# Patient Record
Sex: Male | Born: 1935 | Race: White | Hispanic: No | Marital: Married | Smoking: Never smoker
Health system: Southern US, Community
[De-identification: ages and names within clinical notes are randomized; demographics above are authoritative.]

## PROBLEM LIST (undated history)

## (undated) DIAGNOSIS — I10 Essential (primary) hypertension: Secondary | ICD-10-CM

## (undated) DIAGNOSIS — G9340 Encephalopathy, unspecified: Secondary | ICD-10-CM

## (undated) DIAGNOSIS — I2510 Atherosclerotic heart disease of native coronary artery without angina pectoris: Secondary | ICD-10-CM

## (undated) DIAGNOSIS — J13 Pneumonia due to Streptococcus pneumoniae: Secondary | ICD-10-CM

## (undated) DIAGNOSIS — F0390 Unspecified dementia without behavioral disturbance: Secondary | ICD-10-CM

## (undated) DIAGNOSIS — N289 Disorder of kidney and ureter, unspecified: Secondary | ICD-10-CM

## (undated) DIAGNOSIS — C61 Malignant neoplasm of prostate: Secondary | ICD-10-CM

## (undated) HISTORY — PX: CARDIAC SURGERY: SHX584

## (undated) HISTORY — PX: INSERTION PROSTATE RADIATION SEED: SUR718

## (undated) HISTORY — PX: EYE SURGERY: SHX253

## (undated) HISTORY — PX: INGUINAL HERNIA REPAIR: SUR1180

---

## 1994-03-04 HISTORY — PX: CORONARY ARTERY BYPASS GRAFT: SHX141

## 2012-02-20 ENCOUNTER — Emergency Department (HOSPITAL_COMMUNITY)
Admission: EM | Admit: 2012-02-20 | Discharge: 2012-02-20 | Disposition: A | Discharge status: Home / Self Care | Payer: Medicare Other | Attending: Emergency Medicine | Admitting: Emergency Medicine

## 2012-02-20 ENCOUNTER — Encounter (HOSPITAL_COMMUNITY): Payer: Self-pay | Admitting: Emergency Medicine

## 2012-02-20 ENCOUNTER — Emergency Department (HOSPITAL_COMMUNITY): Payer: Medicare Other

## 2012-02-20 DIAGNOSIS — Z87891 Personal history of nicotine dependence: Secondary | ICD-10-CM | POA: Insufficient documentation

## 2012-02-20 DIAGNOSIS — F0390 Unspecified dementia without behavioral disturbance: Secondary | ICD-10-CM | POA: Insufficient documentation

## 2012-02-20 DIAGNOSIS — Y9210 Unspecified residential institution as the place of occurrence of the external cause: Secondary | ICD-10-CM | POA: Insufficient documentation

## 2012-02-20 DIAGNOSIS — Z87448 Personal history of other diseases of urinary system: Secondary | ICD-10-CM | POA: Insufficient documentation

## 2012-02-20 DIAGNOSIS — Z8701 Personal history of pneumonia (recurrent): Secondary | ICD-10-CM | POA: Insufficient documentation

## 2012-02-20 DIAGNOSIS — Y939 Activity, unspecified: Secondary | ICD-10-CM | POA: Insufficient documentation

## 2012-02-20 DIAGNOSIS — Z79899 Other long term (current) drug therapy: Secondary | ICD-10-CM | POA: Insufficient documentation

## 2012-02-20 DIAGNOSIS — S0181XA Laceration without foreign body of other part of head, initial encounter: Secondary | ICD-10-CM

## 2012-02-20 DIAGNOSIS — S0180XA Unspecified open wound of other part of head, initial encounter: Secondary | ICD-10-CM | POA: Insufficient documentation

## 2012-02-20 DIAGNOSIS — W19XXXA Unspecified fall, initial encounter: Secondary | ICD-10-CM

## 2012-02-20 DIAGNOSIS — W1809XA Striking against other object with subsequent fall, initial encounter: Secondary | ICD-10-CM | POA: Insufficient documentation

## 2012-02-20 DIAGNOSIS — Z7982 Long term (current) use of aspirin: Secondary | ICD-10-CM | POA: Insufficient documentation

## 2012-02-20 HISTORY — DX: Unspecified dementia, unspecified severity, without behavioral disturbance, psychotic disturbance, mood disturbance, and anxiety: F03.90

## 2012-02-20 HISTORY — DX: Pneumonia due to Streptococcus pneumoniae: J13

## 2012-02-20 HISTORY — DX: Disorder of kidney and ureter, unspecified: N28.9

## 2012-02-20 LAB — COMPREHENSIVE METABOLIC PANEL
AST: 30 U/L (ref 0–37)
CO2: 26 mEq/L (ref 19–32)
Calcium: 9.6 mg/dL (ref 8.4–10.5)
Creatinine, Ser: 1.26 mg/dL (ref 0.50–1.35)
GFR calc non Af Amer: 51 mL/min — ABNORMAL LOW (ref >90)

## 2012-02-20 LAB — CBC WITH DIFFERENTIAL/PLATELET
Basophils Absolute: 0 10*3/uL (ref 0.0–0.1)
Basophils Relative: 0 % (ref 0–1)
Eosinophils Absolute: 0.7 10*3/uL (ref 0.0–0.7)
Eosinophils Relative: 7 % — ABNORMAL HIGH (ref 0–5)
HCT: 44.3 % (ref 39.0–52.0)
Lymphocytes Relative: 22 % (ref 12–46)
MCH: 33 pg (ref 26.0–34.0)
MCHC: 33.6 g/dL (ref 30.0–36.0)
MCV: 98 fL (ref 78.0–100.0)
Monocytes Absolute: 0.7 10*3/uL (ref 0.1–1.0)
RDW: 14 % (ref 11.5–15.5)

## 2012-02-20 MED ORDER — LIDOCAINE-EPINEPHRINE-TETRACAINE (LET) SOLUTION
3.0000 mL | Freq: Once | NASAL | Status: AC
  Administered 2012-02-20: 3 mL via TOPICAL
  Filled 2012-02-20: qty 3

## 2012-02-20 NOTE — ED Notes (Signed)
Informed Dr Devoria Albe that LET been applied to area approx 30 mins. Area is white, blanch looking.

## 2012-02-20 NOTE — ED Provider Notes (Signed)
History     CSN: [id number]  Arrival date & time 02/20/12  0806   First MD Initiated Contact with Patient 02/20/12 [phone]      Chief Complaint  Patient presents with  . Fall  . face aceration    Level V caveat for dementia  (Consider location/radiation/quality/duration/timing/severity/associated sxs/prior treatment) HPI Patient presents via EMS with c-collar and KED in place. Evidently sometime this morning he tried to get up out of bed instead of calling for help and fell near his bed. Nursing staff denied loss of consciousness to EMS however his fall was unwitnessed. Patient hit his head and has a laceration near his left lateral eyebrow. Patient's only complaint is that he's getting a headache and he is complaining about the  c-collar.  Daughter is here. She states prior to 3 months ago patient was living at home however he got to the point where he was confused and agitated and could not figure out how to walk. He was in rehabilitation for 3 months and was walking well when he was transferred to a local nursing home about a week ago. She reports he has not gotten physical therapy since he has been there. She also expresses concern that he has a lot of shaking and he needs a straw to drink liquids and this nursing facility has not been doing that. She also is concerned they're not giving has his snacks. She is concerned that he may be dehydrated. She reports otherwise he seems like his usual self.  PCP physicians home visits? Dr. Redmond School  Past Medical History  Diagnosis Date  . Dementia   . Renal disorder   . Pneumococcal pneumonia     Past Surgical History  Procedure Date  . Cardiac surgery     No family history on file.  History  Substance Use Topics  . Smoking status: Quit smoker years ago  . Smokeless tobacco: Not on file  . Alcohol Use: No  Retired alcohol distributer Lives in NH   Review of Systems  All other systems reviewed and are negative.    Allergies    Review of patient's allergies indicates no known allergies.  Home Medications   Current Outpatient Rx  Name  Route  Sig  Dispense  Refill  . ALBUTEROL SULFATE HFA 108 (90 BASE) MCG/ACT IN AERS   Inhalation   Inhale 2 puffs into the lungs every 6 (six) hours as needed. breathing         . ASPIRIN EC 81 MG PO TBEC   Oral   Take 81 mg by mouth daily.         . ATORVASTATIN CALCIUM 20 MG PO TABS   Oral   Take 20 mg by mouth daily.         Marland Kitchen CALCIUM CARBONATE 1250 MG PO CHEW   Oral   Chew 1 tablet by mouth 2 (two) times daily.         Marland Kitchen VITAMIN D-3 1000 UNITS PO CAPS   Oral   Take 1 capsule by mouth daily.         Marland Kitchen CITALOPRAM HYDROBROMIDE 20 MG PO TABS   Oral   Take 20 mg by mouth daily.         . GENTAMICIN SULFATE 0.3 % OP SOLN   Both Eyes   Place 2 drops into both eyes every 4 (four) hours.         Marland Kitchen BABY SHAMPOO EX   Apply externally  Apply 1 application topically daily. Gentle scrub to right eye daily         . LIDOCAINE 5 % EX PTCH   Transdermal   Place 1 patch onto the skin daily. Remove & Discard patch within 12 hours or as directed by MD         . CEROVITE SENIOR PO   Oral   Take 1 capsule by mouth 2 (two) times daily.         Marland Kitchen TAMSULOSIN HCL 0.4 MG PO CAPS   Oral   Take 0.4 mg by mouth daily.           BP 111/93  Pulse 64  Temp 97.3 F (36.3 C) (Oral)  Resp 20  SpO2 99%  Vital signs normal    Physical Exam  Nursing note and vitals reviewed. Constitutional: He appears well-developed and well-nourished.  Non-toxic appearance. He does not appear ill. No distress.       Alert, cooperative  HENT:  Head: Normocephalic.    Right Ear: External ear normal.  Left Ear: External ear normal.  Nose: Nose normal. No mucosal edema or rhinorrhea.  Mouth/Throat: Oropharynx is clear and moist and mucous membranes are normal. No dental abscesses or uvula swelling.       Patient has a 3 cm linear laceration in his lateral left  eyebrow with dried blood around it. The laceration is through the dermis. It is not actively bleeding at this time.  Eyes: Conjunctivae normal and EOM are normal. Pupils are equal, round, and reactive to light.  Neck: Full passive range of motion without pain.       C-collar in place  Cardiovascular: Normal rate, regular rhythm and normal heart sounds.  Exam reveals no gallop and no friction rub.   No murmur heard. Pulmonary/Chest: Effort normal and breath sounds normal. No respiratory distress. He has no wheezes. He has no rhonchi. He has no rales. He exhibits no tenderness and no crepitus.  Abdominal: Soft. Normal appearance and bowel sounds are normal. He exhibits no distension. There is no tenderness. There is no rebound and no guarding.  Musculoskeletal: Normal range of motion. He exhibits no edema and no tenderness.       Moves all extremities well. Denies any pain on range of motion.  Neurological: He is alert. He has normal strength. No cranial nerve deficit.       Follows commands  Skin: Skin is warm, dry and intact. No rash noted. No erythema. No pallor.  Psychiatric: He has a normal mood and affect. His speech is normal and behavior is normal. His mood appears not anxious.    ED Course  Procedures (including critical care time)   10:10 C collar removed, wound still has dried blood around it and LET hasn't been applied yet.   LACERATION REPAIR Performed by: Ward Givens Authorized by: Ward Givens Consent: Verbal consent obtained. Risks and benefits: risks, benefits and alternatives were discussed Consent given by: patient Patient identity confirmed: provided demographic data Prepped and Draped in normal sterile fashion Wound explored  Laceration Location: lateral left eyebrow  Laceration Length: 3 cm  No Foreign Bodies seen or palpated  Topical anesthetic LET  Amount of cleaning: standard  Skin closure: dermabond  Patient tolerance: Patient tolerated the procedure  well with no immediate complications.  Patient ate a sandwich and drink with no difficulty in the ED. His daughter is satisfied that he is able to eat and feels comfortable taking him back to  his nursing home now.  Results for orders placed during the hospital encounter of 02/20/12  CBC WITH DIFFERENTIAL      Component Value Range   WBC 10.0  4.0 - 10.5 K/uL   RBC 4.52  4.22 - 5.81 MIL/uL   Hemoglobin 14.9  13.0 - 17.0 g/dL   HCT 16.1  09.6 - 04.5 %   MCV 98.0  78.0 - 100.0 fL   MCH 33.0  26.0 - 34.0 pg   MCHC 33.6  30.0 - 36.0 g/dL   RDW 40.9  81.1 - 91.4 %   Platelets 193  150 - 400 K/uL   Neutrophils Relative 64  43 - 77 %   Neutro Abs 6.5  1.7 - 7.7 K/uL   Lymphocytes Relative 22  12 - 46 %   Lymphs Abs 2.2  0.7 - 4.0 K/uL   Monocytes Relative 7  3 - 12 %   Monocytes Absolute 0.7  0.1 - 1.0 K/uL   Eosinophils Relative 7 (*) 0 - 5 %   Eosinophils Absolute 0.7  0.0 - 0.7 K/uL   Basophils Relative 0  0 - 1 %   Basophils Absolute 0.0  0.0 - 0.1 K/uL  COMPREHENSIVE METABOLIC PANEL      Component Value Range   Sodium 140  135 - 145 mEq/L   Potassium 3.8  3.5 - 5.1 mEq/L   Chloride 105  96 - 112 mEq/L   CO2 26  19 - 32 mEq/L   Glucose, Bld 89  70 - 99 mg/dL   BUN 20  6 - 23 mg/dL   Creatinine, Ser 7.82  0.50 - 1.35 mg/dL   Calcium 9.6  8.4 - 95.6 mg/dL   Total Protein 6.8  6.0 - 8.3 g/dL   Albumin 3.6  3.5 - 5.2 g/dL   AST 30  0 - 37 U/L   ALT 28  0 - 53 U/L   Alkaline Phosphatase 139 (*) 39 - 117 U/L   Total Bilirubin 0.8  0.3 - 1.2 mg/dL   GFR calc non Af Amer 51 (*) >90 mL/min   GFR calc Af Amer 59 (*) >90 mL/min   Laboratory interpretation all normal    Ct Head Wo Contrast  Ct Cervical Spine Wo Contrast  02/20/2012  *RADIOLOGY REPORT*  Clinical Data:  Fall  CT HEAD WITHOUT CONTRAST CT CERVICAL SPINE WITHOUT CONTRAST  Technique:  Multidetector CT imaging of the head and cervical spine was performed following the standard protocol without intravenous contrast.   Multiplanar CT image reconstructions of the cervical spine were also generated.  Comparison:   None  CT HEAD  Findings: Moderate atrophy.  Chronic microvascular ischemic changes in the white matter.  No intracranial hemorrhage, mass, or acute infarct.  Negative for skull fracture.  Air-fluid level and bubbly secretions in the left maxillary sinus compatible with sinusitis.  IMPRESSION: No acute intracranial abnormality.  Acute sinusitis.  CT CERVICAL SPINE  Findings: Negative for fracture.  Moderate disc degeneration and spondylosis throughout the cervical spine from C3-C7.  There is moderate facet degeneration bilaterally.  There is extensive degenerative changes C1-C2 with thickening and calcification of the transverse ligament of C1. There is foraminal encroachment at multiple levels due to bony overgrowth.  No soft tissue mass.  IMPRESSION: Negative for fracture.   Original Report Authenticated By: Janeece Riggers, M.D.      1. Fall   2. Laceration of face    Plan discharge  Devoria Albe, MD, Armando Gang  MDM          Ward Givens, MD 02/20/12 1147

## 2012-02-20 NOTE — ED Notes (Signed)
ZOX:[id number]<AV> Expected date:02/20/12<BR> Expected time: 7:56 AM<BR> Means of arrival:Ambulance<BR> Comments:<BR> ems/fall

## 2012-02-20 NOTE — ED Notes (Signed)
Pt. Unable to urinate at this time. Notified the nurse.

## 2012-02-20 NOTE — ED Notes (Signed)
Per EMS pt from [facility] [place] where pt was trying to get out of bed to bathroom this am when he fell, per facility staff pt didn't loose consciousness and either hit head on bed rail or on nightstand because pt has laceration above left eye that is currently not bleeding. Pt has PMH dementia. Pt denies pain at this time.

## 2012-03-29 ENCOUNTER — Emergency Department (HOSPITAL_COMMUNITY): Payer: Medicare HMO

## 2012-03-29 ENCOUNTER — Encounter (HOSPITAL_COMMUNITY): Payer: Self-pay | Admitting: Emergency Medicine

## 2012-03-29 ENCOUNTER — Emergency Department (HOSPITAL_COMMUNITY)
Admission: EM | Admit: 2012-03-29 | Discharge: 2012-03-29 | Disposition: A | Discharge status: Other Acute Inpatient Hospital | Payer: Medicare HMO | Attending: Emergency Medicine | Admitting: Emergency Medicine

## 2012-03-29 DIAGNOSIS — Y9210 Unspecified residential institution as the place of occurrence of the external cause: Secondary | ICD-10-CM | POA: Insufficient documentation

## 2012-03-29 DIAGNOSIS — N259 Disorder resulting from impaired renal tubular function, unspecified: Secondary | ICD-10-CM | POA: Insufficient documentation

## 2012-03-29 DIAGNOSIS — R5381 Other malaise: Secondary | ICD-10-CM | POA: Insufficient documentation

## 2012-03-29 DIAGNOSIS — R5383 Other fatigue: Secondary | ICD-10-CM | POA: Insufficient documentation

## 2012-03-29 DIAGNOSIS — I10 Essential (primary) hypertension: Secondary | ICD-10-CM | POA: Insufficient documentation

## 2012-03-29 DIAGNOSIS — W050XXA Fall from non-moving wheelchair, initial encounter: Secondary | ICD-10-CM | POA: Insufficient documentation

## 2012-03-29 DIAGNOSIS — F0390 Unspecified dementia without behavioral disturbance: Secondary | ICD-10-CM | POA: Insufficient documentation

## 2012-03-29 DIAGNOSIS — M542 Cervicalgia: Secondary | ICD-10-CM | POA: Insufficient documentation

## 2012-03-29 DIAGNOSIS — Z7982 Long term (current) use of aspirin: Secondary | ICD-10-CM | POA: Insufficient documentation

## 2012-03-29 DIAGNOSIS — W19XXXA Unspecified fall, initial encounter: Secondary | ICD-10-CM

## 2012-03-29 DIAGNOSIS — Z8701 Personal history of pneumonia (recurrent): Secondary | ICD-10-CM | POA: Insufficient documentation

## 2012-03-29 DIAGNOSIS — Y9389 Activity, other specified: Secondary | ICD-10-CM | POA: Insufficient documentation

## 2012-03-29 DIAGNOSIS — Z79899 Other long term (current) drug therapy: Secondary | ICD-10-CM | POA: Insufficient documentation

## 2012-03-29 HISTORY — DX: Essential (primary) hypertension: I10

## 2012-03-29 LAB — POCT I-STAT TROPONIN I: Troponin i, poc: 0 ng/mL (ref 0.00–0.08)

## 2012-03-29 LAB — CBC
HCT: 44.6 % (ref 39.0–52.0)
Hemoglobin: 15 g/dL (ref 13.0–17.0)
MCH: 33.6 pg (ref 26.0–34.0)
MCHC: 33.6 g/dL (ref 30.0–36.0)
MCV: 100 fL (ref 78.0–100.0)
Platelets: 222 10*3/uL (ref 150–400)
RBC: 4.46 MIL/uL (ref 4.22–5.81)
RDW: 14.2 % (ref 11.5–15.5)
WBC: 9.6 10*3/uL (ref 4.0–10.5)

## 2012-03-29 LAB — BASIC METABOLIC PANEL
BUN: 21 mg/dL (ref 6–23)
CO2: 25 mEq/L (ref 19–32)
Calcium: 8.9 mg/dL (ref 8.4–10.5)
Chloride: 105 mEq/L (ref 96–112)
Creatinine, Ser: 1.41 mg/dL — ABNORMAL HIGH (ref 0.50–1.35)
GFR calc Af Amer: 51 mL/min — ABNORMAL LOW (ref >90)
GFR calc non Af Amer: 44 mL/min — ABNORMAL LOW (ref >90)
Glucose, Bld: 93 mg/dL (ref 70–99)
Potassium: 4.3 mEq/L (ref 3.5–5.1)
Sodium: 141 mEq/L (ref 135–145)

## 2012-03-29 NOTE — ED Notes (Signed)
ZOX:WR60<AV> Expected date:03/29/12<BR> Expected time: 5:30 PM<BR> Means of arrival:Ambulance<BR> Comments:<BR> Fall, LSB

## 2012-03-29 NOTE — ED Provider Notes (Signed)
History     CSN: [id number]  Arrival date & time 03/29/12  1738   First MD Initiated Contact with Patient 03/29/12 1801      Chief Complaint  Patient presents with  . Fall  . Neck Pain    (Consider location/radiation/quality/duration/timing/severity/associated sxs/prior treatment) HPI Patient presents to the ER in a KED after a fall at his nursing facility. The fall was unwitnessed. The patient cannot recall how he got back up. Patient is unable to provide details and keeps mentioning having a psychiatry appointment earlier today. He is frustrated and says that he feels incapable of doing anything and that he's been much more depressed lately. He says he has been feeling increasingly weak all over and complains of dizziness and "unsteadiness". He also reports mild neck pain. Patient is a level 5 caveat due to dementia, and therefore the ROS was unable to be completed.  Past Medical History  Diagnosis Date  . Dementia   . Renal disorder   . Pneumococcal pneumonia   . Hypertension     Past Surgical History  Procedure Date  . Cardiac surgery     No family history on file.  History  Substance Use Topics  . Smoking status: Never Smoker   . Smokeless tobacco: Not on file  . Alcohol Use: No      Review of Systems Level 5 caveat due to dementia   Allergies  Avelox  Home Medications   Current Outpatient Rx  Name  Route  Sig  Dispense  Refill  . ALBUTEROL SULFATE HFA 108 (90 BASE) MCG/ACT IN AERS   Inhalation   Inhale 2 puffs into the lungs 3 (three) times daily. breathing         . ASPIRIN 81 MG PO CHEW   Oral   Chew 81 mg by mouth daily.         . ATORVASTATIN CALCIUM 20 MG PO TABS   Oral   Take 20 mg by mouth at bedtime.          Marland Kitchen CALCIUM CARBONATE ANTACID 500 MG PO CHEW   Oral   Chew 1 tablet by mouth 2 (two) times daily.         Marland Kitchen VITAMIN D 1000 UNITS PO TABS   Oral   Take 1,000 Units by mouth daily.         Marland Kitchen CITALOPRAM HYDROBROMIDE 20  MG PO TABS   Oral   Take 20 mg by mouth daily.         Marland Kitchen HYPROMELLOSE 0.4 % OP SOLN   Both Eyes   Place 2 drops into both eyes 4 (four) times daily.         Marland Kitchen BABY SHAMPOO EX   Apply externally   Apply 1 application topically daily. Gentle scrub both eye lids daily as directed         . MIRTAZAPINE 15 MG PO TABS   Oral   Take 15 mg by mouth at bedtime.         . CEROVITE SENIOR PO   Oral   Take 1 capsule by mouth 2 (two) times daily.         Marland Kitchen POLYETHYLENE GLYCOL 3350 PO PACK   Oral   Take 17 g by mouth daily as needed. Constipation         . TAMSULOSIN HCL 0.4 MG PO CAPS   Oral   Take 0.4 mg by mouth daily.         Marland Kitchen  TIOTROPIUM BROMIDE MONOHYDRATE 18 MCG IN CAPS   Inhalation   Place 18 mcg into inhaler and inhale daily.            BP 103/58  Pulse 72  Temp 98.5 F (36.9 C) (Oral)  Resp 16  SpO2 100%  Physical Exam  Constitutional: He appears well-developed and well-nourished. No distress.  HENT:  Head: Normocephalic and atraumatic.  Mouth/Throat: Oropharynx is clear and moist.  Eyes: Conjunctivae normal and EOM are normal. Pupils are equal, round, and reactive to light. Right conjunctiva is not injected. Right conjunctiva has no hemorrhage. No scleral icterus.  Cardiovascular: Normal rate, regular rhythm and normal heart sounds.  Exam reveals no gallop and no friction rub.   No murmur heard. Pulmonary/Chest: Effort normal and breath sounds normal.  Neurological: He is alert. No cranial nerve deficit.  Skin: Skin is warm and dry. No rash noted. No erythema. No pallor.  Psychiatric: His speech is normal and behavior is normal. He expresses no homicidal and no suicidal ideation.    ED Course  Procedures (including critical care time)  Patient awaking labs and x-rays  MDM          Carlyle Dolly, PA-C 03/29/12 2101

## 2012-03-29 NOTE — ED Provider Notes (Signed)
Medical screening examination/treatment/procedure(s) were conducted as a shared visit with non-physician practitioner(s) and myself.  I personally evaluated the patient during the encounter    Illya Gienger, MD 03/29/12 2306 

## 2012-03-29 NOTE — ED Notes (Signed)
Pt made aware of need for urine specimen. Pt unable to urinate at this time. Will notify staff when sample is provide. Will continue to monitor.

## 2012-03-29 NOTE — ED Notes (Signed)
Pt from [facility] via EMS post fall. Pt c/o L sided neck pain. Pt was transferring from wheelchair to bed and fell. Pt did not hit head, have LOC and has no visible injury. Pt in NAD and A&O

## 2012-03-29 NOTE — ED Notes (Signed)
NT informed me that she was unable to complete the in & out cath d/t resistance met during insertion. Catheter was unable to be advanced to the bladder. However, the pt's depends were saturated with urine. Going to try removing the depends and catching UO into a urinal.

## 2012-03-29 NOTE — ED Notes (Signed)
Report called to Ney NT, Luna Kitchens.

## 2012-03-29 NOTE — ED Notes (Signed)
Bedside report received from previous RN 

## 2012-03-29 NOTE — ED Notes (Signed)
[facility] called for transport.  

## 2012-07-12 ENCOUNTER — Emergency Department (HOSPITAL_COMMUNITY)
Admission: EM | Admit: 2012-07-12 | Discharge: 2012-07-12 | Disposition: A | Discharge status: Home / Self Care | Payer: Medicare HMO | Attending: Emergency Medicine | Admitting: Emergency Medicine

## 2012-07-12 ENCOUNTER — Encounter (HOSPITAL_COMMUNITY): Payer: Self-pay

## 2012-07-12 DIAGNOSIS — Z7982 Long term (current) use of aspirin: Secondary | ICD-10-CM | POA: Insufficient documentation

## 2012-07-12 DIAGNOSIS — Z9889 Other specified postprocedural states: Secondary | ICD-10-CM | POA: Insufficient documentation

## 2012-07-12 DIAGNOSIS — Y9389 Activity, other specified: Secondary | ICD-10-CM | POA: Insufficient documentation

## 2012-07-12 DIAGNOSIS — S51011A Laceration without foreign body of right elbow, initial encounter: Secondary | ICD-10-CM

## 2012-07-12 DIAGNOSIS — Z8701 Personal history of pneumonia (recurrent): Secondary | ICD-10-CM | POA: Insufficient documentation

## 2012-07-12 DIAGNOSIS — Z87448 Personal history of other diseases of urinary system: Secondary | ICD-10-CM | POA: Insufficient documentation

## 2012-07-12 DIAGNOSIS — Z79899 Other long term (current) drug therapy: Secondary | ICD-10-CM | POA: Insufficient documentation

## 2012-07-12 DIAGNOSIS — Y929 Unspecified place or not applicable: Secondary | ICD-10-CM | POA: Insufficient documentation

## 2012-07-12 DIAGNOSIS — W19XXXA Unspecified fall, initial encounter: Secondary | ICD-10-CM

## 2012-07-12 DIAGNOSIS — I10 Essential (primary) hypertension: Secondary | ICD-10-CM | POA: Insufficient documentation

## 2012-07-12 DIAGNOSIS — F0390 Unspecified dementia without behavioral disturbance: Secondary | ICD-10-CM | POA: Insufficient documentation

## 2012-07-12 DIAGNOSIS — IMO0002 Reserved for concepts with insufficient information to code with codable children: Secondary | ICD-10-CM | POA: Insufficient documentation

## 2012-07-12 DIAGNOSIS — W010XXA Fall on same level from slipping, tripping and stumbling without subsequent striking against object, initial encounter: Secondary | ICD-10-CM | POA: Insufficient documentation

## 2012-07-12 NOTE — ED Notes (Signed)
MD at bedside. 

## 2012-07-12 NOTE — ED Notes (Signed)
Per EMS, Pt, from [facility], presents after a witnessed fall.  Pt denies pain and denies LOC.  Facility sts Pt tripped over his walker and fell.  Sts Pt hit is head.  No deformities or hematomas noted.  Skin tear on R elbow noted.  A & Ox3.  Pt is at neuro baseline.

## 2012-07-12 NOTE — ED Provider Notes (Signed)
History     CSN: [id number]  Arrival date & time 07/12/12  6295   First MD Initiated Contact with Patient 07/12/12 1020      Chief Complaint  Patient presents with  . Fall    (Consider location/radiation/quality/duration/timing/severity/associated sxs/prior treatment) Patient is a 79 y.o. male presenting with fall. The history is provided by the patient.  Fall The accident occurred less than 1 hour ago.  pt tripped over a walker, no loc--denies any sx prior to event--c/o abrasion to right elbow--no chest pain, sob, abdominal pain--denies any hip or back pain--ems called and pt transferred here--no tx used pta  Past Medical History  Diagnosis Date  . Dementia   . Renal disorder   . Pneumococcal pneumonia   . Hypertension     Past Surgical History  Procedure Laterality Date  . Cardiac surgery      History reviewed. No pertinent family history.  History  Substance Use Topics  . Smoking status: Never Smoker   . Smokeless tobacco: Not on file  . Alcohol Use: No      Review of Systems  All other systems reviewed and are negative.    Allergies  Avelox; Ciprofloxacin; and Floxin  Home Medications   Current Outpatient Rx  Name  Route  Sig  Dispense  Refill  . albuterol (PROVENTIL HFA;VENTOLIN HFA) 108 (90 BASE) MCG/ACT inhaler   Inhalation   Inhale 2 puffs into the lungs 3 (three) times daily. breathing         . aspirin 81 MG chewable tablet   Oral   Chew 81 mg by mouth daily.         Marland Kitchen atorvastatin (LIPITOR) 20 MG tablet   Oral   Take 20 mg by mouth at bedtime.          . calcium carbonate (TUMS - DOSED IN MG ELEMENTAL CALCIUM) 500 MG chewable tablet   Oral   Chew 1 tablet by mouth 2 (two) times daily.         . cholecalciferol (VITAMIN D) 1000 UNITS tablet   Oral   Take 1,000 Units by mouth daily.         . citalopram (CELEXA) 20 MG tablet   Oral   Take 20 mg by mouth daily.         . Hypromellose (NATURAL BALANCE TEARS) 0.4 %  SOLN   Both Eyes   Place 2 drops into both eyes 4 (four) times daily.         . mirtazapine (REMERON) 15 MG tablet   Oral   Take 15 mg by mouth at bedtime.         . Multiple Vitamins-Minerals (CEROVITE SENIOR PO)   Oral   Take 1 capsule by mouth 2 (two) times daily.         . polyethylene glycol (MIRALAX / GLYCOLAX) packet   Oral   Take 17 g by mouth daily as needed. Constipation         . Tamsulosin HCl (FLOMAX) 0.4 MG CAPS   Oral   Take 0.4 mg by mouth daily.         Marland Kitchen tiotropium (SPIRIVA) 18 MCG inhalation capsule   Inhalation   Place 18 mcg into inhaler and inhale daily.          . Infant Care Products (BABY SHAMPOO EX)   Apply externally   Apply 1 application topically daily. Gentle scrub both eye lids daily as directed  BP 119/74  Pulse 77  Temp(Src) 99.1 F (37.3 C) (Oral)  Resp 18  SpO2 95%  Physical Exam  Nursing note and vitals reviewed. Constitutional: He is oriented to person, place, and time. He appears well-developed and well-nourished.  Non-toxic appearance. No distress.  HENT:  Head: Normocephalic and atraumatic.  Eyes: Conjunctivae, EOM and lids are normal. Pupils are equal, round, and reactive to light.  Neck: Normal range of motion. Neck supple. No tracheal deviation present. No mass present.  Cardiovascular: Normal rate, regular rhythm and normal heart sounds.  Exam reveals no gallop.   No murmur heard. Pulmonary/Chest: Effort normal and breath sounds normal. No stridor. No respiratory distress. He has no decreased breath sounds. He has no wheezes. He has no rhonchi. He has no rales.  Abdominal: Soft. Normal appearance and bowel sounds are normal. He exhibits no distension. There is no tenderness. There is no rebound and no CVA tenderness.  Musculoskeletal: Normal range of motion. He exhibits no edema and no tenderness.  Skin tear at elbow   Neurological: He is alert and oriented to person, place, and time. He has normal  strength. No cranial nerve deficit or sensory deficit. GCS eye subscore is 4. GCS verbal subscore is 5. GCS motor subscore is 6.  Skin: Skin is warm and dry. No abrasion and no rash noted.  Psychiatric: He has a normal mood and affect. His speech is normal and behavior is normal.    ED Course  Procedures (including critical care time)  Labs Reviewed - No data to display No results found.   No diagnosis found.    MDM  pts with skin tear--treated by nursing with bandage--no evidence of head trauma, no indication for head ct        Toy Baker, MD 07/12/12 1039

## 2012-07-12 NOTE — ED Notes (Signed)
Pt escorted to discharge window. Verbalized understanding discharge instructions. In no acute distress.   

## 2012-12-16 ENCOUNTER — Encounter (HOSPITAL_COMMUNITY): Payer: Self-pay | Admitting: Emergency Medicine

## 2012-12-16 ENCOUNTER — Emergency Department (HOSPITAL_COMMUNITY)
Admission: EM | Admit: 2012-12-16 | Discharge: 2012-12-16 | Disposition: A | Discharge status: Skilled Nursing Facility | Payer: Medicare HMO | Attending: Emergency Medicine | Admitting: Emergency Medicine

## 2012-12-16 ENCOUNTER — Emergency Department (HOSPITAL_COMMUNITY): Payer: Medicare HMO

## 2012-12-16 DIAGNOSIS — Z79899 Other long term (current) drug therapy: Secondary | ICD-10-CM | POA: Insufficient documentation

## 2012-12-16 DIAGNOSIS — Z8701 Personal history of pneumonia (recurrent): Secondary | ICD-10-CM | POA: Insufficient documentation

## 2012-12-16 DIAGNOSIS — Y9301 Activity, walking, marching and hiking: Secondary | ICD-10-CM | POA: Insufficient documentation

## 2012-12-16 DIAGNOSIS — Z87448 Personal history of other diseases of urinary system: Secondary | ICD-10-CM | POA: Insufficient documentation

## 2012-12-16 DIAGNOSIS — Z7982 Long term (current) use of aspirin: Secondary | ICD-10-CM | POA: Insufficient documentation

## 2012-12-16 DIAGNOSIS — F0390 Unspecified dementia without behavioral disturbance: Secondary | ICD-10-CM | POA: Insufficient documentation

## 2012-12-16 DIAGNOSIS — I10 Essential (primary) hypertension: Secondary | ICD-10-CM | POA: Insufficient documentation

## 2012-12-16 DIAGNOSIS — Y9210 Unspecified residential institution as the place of occurrence of the external cause: Secondary | ICD-10-CM | POA: Insufficient documentation

## 2012-12-16 DIAGNOSIS — S0990XA Unspecified injury of head, initial encounter: Secondary | ICD-10-CM

## 2012-12-16 DIAGNOSIS — S060X0A Concussion without loss of consciousness, initial encounter: Secondary | ICD-10-CM | POA: Insufficient documentation

## 2012-12-16 DIAGNOSIS — W010XXA Fall on same level from slipping, tripping and stumbling without subsequent striking against object, initial encounter: Secondary | ICD-10-CM | POA: Insufficient documentation

## 2012-12-16 NOTE — ED Provider Notes (Signed)
CSN: [id number]     Arrival date & time 12/16/12  9562 History   First MD Initiated Contact with Patient 12/16/12 [phone]     Chief Complaint  Patient presents with  . Fall  . Head Injury    posterior   (Consider location/radiation/quality/duration/timing/severity/associated sxs/prior Treatment) HPI Comments: Patient presents to the ER from skilled nursing facility after a fall. She was walking with his walker, lost his balance and fell backwards. He hit his head on the ground, no loss of consciousness. Patient confused at arrival, per his baseline. Does not take blood thinners. Reports that he did hit his head, but only minimal headache. No neck pain, back pain, chest pain, shortness of breath. Denies extremity injury.  Patient is a 79 y.o. male presenting with fall and head injury.  Fall Associated symptoms include headaches.  Head Injury Associated symptoms: headache     Past Medical History  Diagnosis Date  . Dementia   . Renal disorder   . Pneumococcal pneumonia   . Hypertension    Past Surgical History  Procedure Laterality Date  . Cardiac surgery     No family history on file. History  Substance Use Topics  . Smoking status: Never Smoker   . Smokeless tobacco: Not on file  . Alcohol Use: No    Review of Systems  Neurological: Positive for headaches. Negative for dizziness and syncope.  All other systems reviewed and are negative.    Allergies  Avelox; Ciprofloxacin; and Floxin  Home Medications   Current Outpatient Rx  Name  Route  Sig  Dispense  Refill  . albuterol (PROVENTIL HFA;VENTOLIN HFA) 108 (90 BASE) MCG/ACT inhaler   Inhalation   Inhale 2 puffs into the lungs 3 (three) times daily. breathing         . aspirin 81 MG chewable tablet   Oral   Chew 81 mg by mouth daily.         Marland Kitchen atorvastatin (LIPITOR) 20 MG tablet   Oral   Take 20 mg by mouth at bedtime.          . calcium carbonate (TUMS - DOSED IN MG ELEMENTAL CALCIUM) 500 MG chewable  tablet   Oral   Chew 1 tablet by mouth 2 (two) times daily.         . citalopram (CELEXA) 20 MG tablet   Oral   Take 20 mg by mouth daily.         . mirtazapine (REMERON) 15 MG tablet   Oral   Take 15 mg by mouth at bedtime.         . Multiple Vitamins-Minerals (CEROVITE SENIOR PO)   Oral   Take 1 capsule by mouth 2 (two) times daily.         . Tamsulosin HCl (FLOMAX) 0.4 MG CAPS   Oral   Take 0.4 mg by mouth daily.         Marland Kitchen tiotropium (SPIRIVA) 18 MCG inhalation capsule   Inhalation   Place 18 mcg into inhaler and inhale daily.          . polyethylene glycol (MIRALAX / GLYCOLAX) packet   Oral   Take 17 g by mouth daily as needed (constipation). Constipation          BP 103/60  Pulse 70  Temp(Src) 98.6 F (37 C) (Oral)  Resp 20  SpO2 97% Physical Exam  Constitutional: He is oriented to person, place, and time. He appears well-developed and well-nourished. No distress.  HENT:  Head: Normocephalic and atraumatic.  Right Ear: Hearing normal.  Left Ear: Hearing normal.  Nose: Nose normal.  Mouth/Throat: Oropharynx is clear and moist and mucous membranes are normal.  Eyes: Conjunctivae and EOM are normal. Pupils are equal, round, and reactive to light.  Neck: Normal range of motion. Neck supple. No spinous process tenderness and no muscular tenderness present.  Cardiovascular: Regular rhythm, S1 normal and S2 normal.  Exam reveals no gallop and no friction rub.   No murmur heard. Pulmonary/Chest: Effort normal and breath sounds normal. No respiratory distress. He exhibits no tenderness.  Abdominal: Soft. Normal appearance and bowel sounds are normal. There is no hepatosplenomegaly. There is no tenderness. There is no rebound, no guarding, no tenderness at McBurney's point and negative Murphy's sign. No hernia.  Musculoskeletal: Normal range of motion.       Right hip: Normal.       Left hip: Normal.       Cervical back: Normal.       Thoracic back:  Normal.       Lumbar back: Normal.  Neurological: He is alert and oriented to person, place, and time. He has normal strength. No cranial nerve deficit or sensory deficit. Coordination normal. GCS eye subscore is 4. GCS verbal subscore is 5. GCS motor subscore is 6.  Skin: Skin is warm, dry and intact. No rash noted. No cyanosis.  Psychiatric: He has a normal mood and affect. His speech is normal and behavior is normal. Thought content normal.    ED Course  Procedures (including critical care time) Labs Review Labs Reviewed - No data to display Imaging Review Ct Head Wo Contrast  12/16/2012   CLINICAL DATA:  Fall with head injury.  EXAM: CT HEAD WITHOUT CONTRAST  TECHNIQUE: Contiguous axial images were obtained from the base of the skull through the vertex without intravenous contrast.  COMPARISON:  Head CT 03/29/2012.  FINDINGS: There is no evidence of acute intracranial hemorrhage, mass lesion, brain edema or extra-axial fluid collection. The ventricles and subarachnoid spaces remain diffusely enlarged consistent with atrophy. There is no CT evidence of acute cortical infarction.  The visualized paranasal sinuses, mastoid air cells and middle ears are clear. The calvarium is intact.  IMPRESSION: Stable atrophy.  No acute intracranial or calvarial findings.   Electronically Signed   By: Roxy Horseman M.D.   On: 12/16/2012 10:30    EKG Interpretation   None       MDM   diagnosis: Minor head injury secondary to fall  Patient presents to ER from skilled nursing facility after a fall. Patient did hit the back of his head on the ground, but does not have any focal neurologic findings on examination. He was sent to radiology for CT scan of head which did not show any acute intracranial injury. The remainder of his examination was unremarkable, no concern for injury elsewhere, including head injury or spinal injury.   Gilda Crease, MD 12/16/12 1041

## 2012-12-16 NOTE — ED Notes (Signed)
Per EMS pt was walking with walker in lobby when pt fell backwards hitting his head. Pt not on blood thinners except for daily aspirin.  Denies lOC. No injuries are noted.

## 2012-12-16 NOTE — ED Notes (Signed)
Patient transported to CT 

## 2013-01-03 ENCOUNTER — Emergency Department (HOSPITAL_COMMUNITY): Payer: Medicare HMO

## 2013-01-03 ENCOUNTER — Inpatient Hospital Stay (HOSPITAL_COMMUNITY)
Admission: EM | Admit: 2013-01-03 | Discharge: 2013-01-08 | DRG: 871 | Disposition: A | Discharge status: Skilled Nursing Facility | Payer: Medicare HMO | Attending: Internal Medicine | Admitting: Internal Medicine

## 2013-01-03 ENCOUNTER — Encounter (HOSPITAL_COMMUNITY): Payer: Self-pay | Admitting: Emergency Medicine

## 2013-01-03 DIAGNOSIS — N200 Calculus of kidney: Secondary | ICD-10-CM | POA: Diagnosis present

## 2013-01-03 DIAGNOSIS — F0390 Unspecified dementia without behavioral disturbance: Secondary | ICD-10-CM | POA: Diagnosis present

## 2013-01-03 DIAGNOSIS — N179 Acute kidney failure, unspecified: Secondary | ICD-10-CM | POA: Diagnosis present

## 2013-01-03 DIAGNOSIS — R404 Transient alteration of awareness: Secondary | ICD-10-CM | POA: Diagnosis present

## 2013-01-03 DIAGNOSIS — Z79899 Other long term (current) drug therapy: Secondary | ICD-10-CM

## 2013-01-03 DIAGNOSIS — I2510 Atherosclerotic heart disease of native coronary artery without angina pectoris: Secondary | ICD-10-CM | POA: Insufficient documentation

## 2013-01-03 DIAGNOSIS — Z951 Presence of aortocoronary bypass graft: Secondary | ICD-10-CM

## 2013-01-03 DIAGNOSIS — N390 Urinary tract infection, site not specified: Secondary | ICD-10-CM | POA: Diagnosis present

## 2013-01-03 DIAGNOSIS — A419 Sepsis, unspecified organism: Principal | ICD-10-CM | POA: Diagnosis present

## 2013-01-03 DIAGNOSIS — Z8546 Personal history of malignant neoplasm of prostate: Secondary | ICD-10-CM

## 2013-01-03 DIAGNOSIS — N4289 Other specified disorders of prostate: Secondary | ICD-10-CM | POA: Diagnosis present

## 2013-01-03 DIAGNOSIS — I10 Essential (primary) hypertension: Secondary | ICD-10-CM | POA: Diagnosis present

## 2013-01-03 DIAGNOSIS — I129 Hypertensive chronic kidney disease with stage 1 through stage 4 chronic kidney disease, or unspecified chronic kidney disease: Secondary | ICD-10-CM | POA: Diagnosis present

## 2013-01-03 DIAGNOSIS — R338 Other retention of urine: Secondary | ICD-10-CM | POA: Diagnosis present

## 2013-01-03 DIAGNOSIS — R339 Retention of urine, unspecified: Secondary | ICD-10-CM | POA: Diagnosis present

## 2013-01-03 DIAGNOSIS — N201 Calculus of ureter: Secondary | ICD-10-CM | POA: Diagnosis present

## 2013-01-03 DIAGNOSIS — R531 Weakness: Secondary | ICD-10-CM

## 2013-01-03 DIAGNOSIS — N189 Chronic kidney disease, unspecified: Secondary | ICD-10-CM | POA: Diagnosis present

## 2013-01-03 DIAGNOSIS — N35919 Unspecified urethral stricture, male, unspecified site: Secondary | ICD-10-CM | POA: Diagnosis present

## 2013-01-03 DIAGNOSIS — G9340 Encephalopathy, unspecified: Secondary | ICD-10-CM | POA: Diagnosis present

## 2013-01-03 DIAGNOSIS — C61 Malignant neoplasm of prostate: Secondary | ICD-10-CM

## 2013-01-03 DIAGNOSIS — N1330 Unspecified hydronephrosis: Secondary | ICD-10-CM | POA: Diagnosis present

## 2013-01-03 HISTORY — DX: Malignant neoplasm of prostate: C61

## 2013-01-03 HISTORY — DX: Atherosclerotic heart disease of native coronary artery without angina pectoris: I25.10

## 2013-01-03 HISTORY — DX: Encephalopathy, unspecified: G93.40

## 2013-01-03 LAB — URINALYSIS, ROUTINE W REFLEX MICROSCOPIC
Bilirubin Urine: NEGATIVE
Glucose, UA: NEGATIVE mg/dL
Ketones, ur: NEGATIVE mg/dL
Ketones, ur: NEGATIVE mg/dL
Nitrite: NEGATIVE
Protein, ur: NEGATIVE mg/dL
Protein, ur: 30 mg/dL — AB
Specific Gravity, Urine: 1.022 (ref 1.005–1.030)
Urobilinogen, UA: 1 mg/dL (ref 0.0–1.0)
pH: 6 (ref 5.0–8.0)

## 2013-01-03 LAB — COMPREHENSIVE METABOLIC PANEL
ALT: 15 U/L (ref 0–53)
AST: 20 U/L (ref 0–37)
Albumin: 3.1 g/dL — ABNORMAL LOW (ref 3.5–5.2)
CO2: 24 mEq/L (ref 19–32)
Chloride: 106 mEq/L (ref 96–112)
GFR calc non Af Amer: 44 mL/min — ABNORMAL LOW (ref >90)
Potassium: 3.7 mEq/L (ref 3.5–5.1)
Sodium: 139 mEq/L (ref 135–145)
Total Bilirubin: 0.8 mg/dL (ref 0.3–1.2)

## 2013-01-03 LAB — URINE MICROSCOPIC-ADD ON

## 2013-01-03 LAB — CBC WITH DIFFERENTIAL/PLATELET
Basophils Absolute: 0.1 10*3/uL (ref 0.0–0.1)
Basophils Relative: 1 % (ref 0–1)
HCT: 38.7 % — ABNORMAL LOW (ref 39.0–52.0)
Lymphocytes Relative: 15 % (ref 12–46)
MCHC: 34.4 g/dL (ref 30.0–36.0)
Monocytes Absolute: 1.2 10*3/uL — ABNORMAL HIGH (ref 0.1–1.0)
Neutro Abs: 7.9 10*3/uL — ABNORMAL HIGH (ref 1.7–7.7)
Neutrophils Relative %: 73 % (ref 43–77)
Platelets: 194 10*3/uL (ref 150–400)
RDW: 14.3 % (ref 11.5–15.5)
WBC: 10.8 10*3/uL — ABNORMAL HIGH (ref 4.0–10.5)

## 2013-01-03 LAB — CG4 I-STAT (LACTIC ACID): Lactic Acid, Venous: 1.31 mmol/L (ref 0.5–2.2)

## 2013-01-03 MED ORDER — SODIUM CHLORIDE 0.9 % IV BOLUS (SEPSIS)
1000.0000 mL | Freq: Once | INTRAVENOUS | Status: AC
  Administered 2013-01-03: 1000 mL via INTRAVENOUS

## 2013-01-03 MED ORDER — POLYETHYLENE GLYCOL 3350 17 G PO PACK
17.0000 g | PACK | Freq: Every day | ORAL | Status: DC | PRN
  Filled 2013-01-03: qty 1

## 2013-01-03 MED ORDER — MIRTAZAPINE 15 MG PO TABS
15.0000 mg | ORAL_TABLET | Freq: Every day | ORAL | Status: DC
  Administered 2013-01-03 – 2013-01-07 (×5): 15 mg via ORAL
  Filled 2013-01-03 (×6): qty 1

## 2013-01-03 MED ORDER — ONDANSETRON HCL 4 MG PO TABS: 4.0000 mg | ORAL_TABLET | Freq: Four times a day (QID) | ORAL | Status: DC | PRN

## 2013-01-03 MED ORDER — SODIUM CHLORIDE 0.9 % IJ SOLN
3.0000 mL | Freq: Two times a day (BID) | INTRAMUSCULAR | Status: DC
  Administered 2013-01-03 – 2013-01-08 (×6): 3 mL via INTRAVENOUS

## 2013-01-03 MED ORDER — ACETAMINOPHEN 325 MG PO TABS
650.0000 mg | ORAL_TABLET | Freq: Once | ORAL | Status: AC
  Administered 2013-01-03: 650 mg via ORAL
  Filled 2013-01-03: qty 2

## 2013-01-03 MED ORDER — ENOXAPARIN SODIUM 40 MG/0.4ML ~~LOC~~ SOLN
40.0000 mg | SUBCUTANEOUS | Status: DC
  Administered 2013-01-03 – 2013-01-07 (×5): 40 mg via SUBCUTANEOUS
  Filled 2013-01-03 (×6): qty 0.4

## 2013-01-03 MED ORDER — ALBUTEROL SULFATE HFA 108 (90 BASE) MCG/ACT IN AERS
2.0000 | INHALATION_SPRAY | Freq: Three times a day (TID) | RESPIRATORY_TRACT | Status: DC
  Administered 2013-01-03 – 2013-01-08 (×11): 2 via RESPIRATORY_TRACT
  Filled 2013-01-03: qty 6.7

## 2013-01-03 MED ORDER — ACETAMINOPHEN 325 MG PO TABS: 650.0000 mg | ORAL_TABLET | Freq: Four times a day (QID) | ORAL | Status: DC | PRN

## 2013-01-03 MED ORDER — CITALOPRAM HYDROBROMIDE 10 MG PO TABS
15.0000 mg | ORAL_TABLET | Freq: Every day | ORAL | Status: DC
  Administered 2013-01-03 – 2013-01-08 (×6): 15 mg via ORAL
  Filled 2013-01-03 (×6): qty 2

## 2013-01-03 MED ORDER — OXYCODONE HCL 5 MG PO TABS: 5.0000 mg | ORAL_TABLET | Freq: Four times a day (QID) | ORAL | Status: DC | PRN

## 2013-01-03 MED ORDER — SODIUM CHLORIDE 0.9 % IV SOLN
INTRAVENOUS | Status: DC
  Administered 2013-01-03 – 2013-01-04 (×2): via INTRAVENOUS

## 2013-01-03 MED ORDER — SODIUM CHLORIDE 0.9 % IV BOLUS (SEPSIS)
500.0000 mL | Freq: Once | INTRAVENOUS | Status: AC
  Administered 2013-01-03: 500 mL via INTRAVENOUS

## 2013-01-03 MED ORDER — DEXTROSE 5 % IV SOLN
1.0000 g | INTRAVENOUS | Status: DC
  Administered 2013-01-03 – 2013-01-06 (×4): 1 g via INTRAVENOUS
  Filled 2013-01-03 (×6): qty 10

## 2013-01-03 MED ORDER — ASPIRIN 81 MG PO CHEW
81.0000 mg | CHEWABLE_TABLET | Freq: Every day | ORAL | Status: DC
  Administered 2013-01-03 – 2013-01-08 (×6): 81 mg via ORAL
  Filled 2013-01-03 (×6): qty 1

## 2013-01-03 MED ORDER — ATORVASTATIN CALCIUM 20 MG PO TABS
20.0000 mg | ORAL_TABLET | Freq: Every day | ORAL | Status: DC
  Administered 2013-01-03 – 2013-01-07 (×5): 20 mg via ORAL
  Filled 2013-01-03 (×6): qty 1

## 2013-01-03 MED ORDER — TAMSULOSIN HCL 0.4 MG PO CAPS
0.4000 mg | ORAL_CAPSULE | Freq: Every day | ORAL | Status: DC
  Administered 2013-01-03 – 2013-01-08 (×6): 0.4 mg via ORAL
  Filled 2013-01-03 (×6): qty 1

## 2013-01-03 MED ORDER — TIOTROPIUM BROMIDE MONOHYDRATE 18 MCG IN CAPS
18.0000 ug | ORAL_CAPSULE | Freq: Every day | RESPIRATORY_TRACT | Status: DC
  Administered 2013-01-03 – 2013-01-08 (×6): 18 ug via RESPIRATORY_TRACT
  Filled 2013-01-03 (×2): qty 5

## 2013-01-03 MED ORDER — CALCIUM CARBONATE ANTACID 500 MG PO CHEW
1.0000 | CHEWABLE_TABLET | Freq: Two times a day (BID) | ORAL | Status: DC
  Administered 2013-01-03 – 2013-01-08 (×10): 200 mg via ORAL
  Filled 2013-01-03 (×11): qty 1

## 2013-01-03 MED ORDER — ACETAMINOPHEN 650 MG RE SUPP: 650.0000 mg | Freq: Four times a day (QID) | RECTAL | Status: DC | PRN

## 2013-01-03 MED ORDER — ONDANSETRON HCL 4 MG/2ML IJ SOLN: 4.0000 mg | Freq: Four times a day (QID) | INTRAMUSCULAR | Status: DC | PRN

## 2013-01-03 MED ORDER — IOHEXOL 300 MG/ML  SOLN
50.0000 mL | Freq: Once | INTRAMUSCULAR | Status: AC | PRN
  Administered 2013-01-03: 50 mL via ORAL

## 2013-01-03 NOTE — ED Notes (Signed)
Bed: ZO10 Expected date:  Expected time:  Means of arrival:  Comments: Fever, lethargy

## 2013-01-03 NOTE — ED Provider Notes (Addendum)
CSN: [id number]     Arrival date & time 01/03/13  1324 History   First MD Initiated Contact with Patient 01/03/13 0825     Chief Complaint  Patient presents with  . Fatigue  . Fever   (Consider location/radiation/quality/duration/timing/severity/associated sxs/prior Treatment) HPI Comments: 79 year old male presents from to the hospital for fever. He was at his nursing home and they noticed that he was having a fever and so they sent to the ER. Per the daughter at the bedside he may not is arousable this morning but seems to be back to normal now. He's been complaining of intermittent mid abdominal pain for the past couple days. He denies any of this pain now. He is unable to describe exactly when the pain comes and goes. The daughter did taken to person care yesterday really didn't x-ray stated that showed an "enlarged prostate".   Past Medical History  Diagnosis Date  . Dementia   . Renal disorder   . Pneumococcal pneumonia   . Hypertension   . Encephalopathy acute    Past Surgical History  Procedure Laterality Date  . Cardiac surgery     History reviewed. No pertinent family history. History  Substance Use Topics  . Smoking status: Never Smoker   . Smokeless tobacco: Not on file  . Alcohol Use: No    Review of Systems  Unable to perform ROS: Dementia    Allergies  Avelox; Ciprofloxacin; and Floxin  Home Medications   Current Outpatient Rx  Name  Route  Sig  Dispense  Refill  . albuterol (PROVENTIL HFA;VENTOLIN HFA) 108 (90 BASE) MCG/ACT inhaler   Inhalation   Inhale 2 puffs into the lungs 3 (three) times daily. breathing         . aspirin 81 MG chewable tablet   Oral   Chew 81 mg by mouth daily.         Marland Kitchen atorvastatin (LIPITOR) 20 MG tablet   Oral   Take 20 mg by mouth at bedtime.          . calcium carbonate (TUMS - DOSED IN MG ELEMENTAL CALCIUM) 500 MG chewable tablet   Oral   Chew 1 tablet by mouth 2 (two) times daily.         . citalopram  (CELEXA) 10 MG tablet   Oral   Take 15 mg by mouth daily. Takes 1 and 1/2 tablet         . mirtazapine (REMERON) 15 MG tablet   Oral   Take 15 mg by mouth at bedtime.         . Multiple Vitamins-Minerals (CEROVITE SENIOR PO)   Oral   Take 1 capsule by mouth 2 (two) times daily.         . polyethylene glycol (MIRALAX / GLYCOLAX) packet   Oral   Take 17 g by mouth daily as needed (constipation). Constipation         . Tamsulosin HCl (FLOMAX) 0.4 MG CAPS   Oral   Take 0.4 mg by mouth daily.         Marland Kitchen tiotropium (SPIRIVA) 18 MCG inhalation capsule   Inhalation   Place 18 mcg into inhaler and inhale daily.           BP 105/61  Pulse 95  Temp(Src) 100.6 F (38.1 C) (Rectal)  Resp 16  SpO2 94% Physical Exam  Nursing note and vitals reviewed. Constitutional: He is oriented to person, place, and time. He appears well-developed and well-nourished.  HENT:  Head: Normocephalic and atraumatic.  Right Ear: External ear normal.  Left Ear: External ear normal.  Nose: Nose normal.  Eyes: Right eye exhibits no discharge. Left eye exhibits no discharge.  Neck: Neck supple.  Cardiovascular: Normal rate, regular rhythm, normal heart sounds and intact distal pulses.   Pulmonary/Chest: Effort normal. No accessory muscle usage. Not tachypneic. He has rales in the right lower field.  Abdominal: Soft. He exhibits no distension. There is no tenderness.  Musculoskeletal: He exhibits no edema.  Neurological: He is alert and oriented to person, place, and time. He has normal strength. No sensory deficit.  Skin: Skin is warm and dry.    ED Course  Procedures (including critical care time) Labs Review Labs Reviewed  CBC WITH DIFFERENTIAL - Abnormal; Notable for the following:    WBC 10.8 (*)    RBC 3.93 (*)    HCT 38.7 (*)    Neutro Abs 7.9 (*)    Monocytes Absolute 1.2 (*)    All other components within normal limits  COMPREHENSIVE METABOLIC PANEL - Abnormal; Notable for the  following:    Glucose, Bld 122 (*)    Creatinine, Ser 1.41 (*)    Albumin 3.1 (*)    GFR calc non Af Amer 44 (*)    GFR calc Af Amer 51 (*)    All other components within normal limits  URINALYSIS, ROUTINE W REFLEX MICROSCOPIC - Abnormal; Notable for the following:    Hgb urine dipstick LARGE (*)    Leukocytes, UA SMALL (*)    All other components within normal limits  URINE CULTURE  LIPASE, BLOOD  URINE MICROSCOPIC-ADD ON  URINALYSIS, ROUTINE W REFLEX MICROSCOPIC  CG4 I-STAT (LACTIC ACID)   Imaging Review Dg Chest 2 View  01/03/2013   CLINICAL DATA:  Fever. Fatigue.  EXAM: CHEST  2 VIEW  COMPARISON:  None.  FINDINGS: The heart size is within normal limits. Previous CABG noted. Low lung volumes are seen. Diffuse interstitial prominence is likely chronic. Mild linear opacity in the right upper lobe and left lung base is consistent with scarring or atelectasis. No evidence of pulmonary consolidation or pleural effusion.  IMPRESSION: Low lung volumes and probable chronic interstitial lung disease. Mild right upper and left lower lobe scarring versus atelectasis.   Electronically Signed   By: Myles Rosenthal M.D.   On: 01/03/2013 09:16    EKG Interpretation   None       MDM   1. Weakness   2. Urethral stricture unspecified   3. Acute urinary retention    Patient unable to be catheterized in here so urology was consulted. He felt there was a stricture causing urinary retention and dilated and placed a foley. After initial eval daughter states she feels patient is more sleepy/groggy and not acting himself. He then started having some abd pain with mild lower tenderness so head CT and abd CT ordered. Patient is grossly neurologically normal except some mild confusion. Will admit to hospitalist for further w/u.    Audree Camel, MD 01/03/13 1615  Audree Camel, MD 01/03/13 1616

## 2013-01-03 NOTE — Consult Note (Signed)
   Subjective: I was asked to see Ruben Morrison in consultation by Dr. Goldston.  He is an 79 yo WM with a remote history of prostate cancer treated with brachytherapy.  He presents now with signs of sepsis and attempt at foley placement was unsuccessful x 3.   He is demented and was initially unresponsive and unable to provide additional history.  His daughter was available and reports no other GU history or recent voiding complaints but he has been on tamsulosin.   Past Medical History  Diagnosis Date  . Dementia   . Renal disorder   . Pneumococcal pneumonia   . Hypertension   . Encephalopathy acute   . Prostate cancer   . Coronary artery disease    Past Surgical History  Procedure Laterality Date  . Cardiac surgery    . Insertion prostate radiation seed    . Inguinal hernia repair     History   Social History  . Marital Status: Married    Spouse Name: N/A    Number of Children: N/A  . Years of Education: N/A   Occupational History  . Not on file.   Social History Main Topics  . Smoking status: Never Smoker   . Smokeless tobacco: Not on file  . Alcohol Use: No  . Drug Use: No  . Sexual Activity: Not on file   Other Topics Concern  . Not on file   Social History Narrative  . No narrative on file  Pt lives in a memory care facility.    History reviewed. No pertinent family history.  Allergies  Allergen Reactions  . Avelox [Moxifloxacin]     Reaction:Unknown Per medication MAR  . Ciprofloxacin     Unknown reaction- per MAR  . Floxin [Ofloxacin]     Unknown reaction- per MAR    ROS: History obtained from unobtainable from patient due to mental status and I spoke to the daughter who was able to provide limited details of the past medical history.   Objective: Vital signs in last 24 hours: Temp:  [100.3 F (37.9 C)-100.6 F (38.1 C)] 100.6 F (38.1 C) (11/02 0818) Pulse Rate:  [64-95] 64 (11/02 1213) Resp:  [16-22] 20 (11/02 1213) BP:  (105-117)/(61-67) 117/62 mmHg (11/02 1213) SpO2:  [93 %-97 %] 96 % (11/02 1213)  Intake/Output from previous day:   Intake/Output this shift:    General appearance: cooperative and slowed mentation GI: soft and flat.  No mass or tenderness. Male genitalia: Uncircumcised with mild phimosis and an adequate meatus with blood post cath attempts.   scrotum and testes unremarkable.   Lab Results:   Recent Labs  01/03/13 0929  WBC 10.8*  HGB 13.3  HCT 38.7*  PLT 194   BMET  Recent Labs  01/03/13 0929  NA 139  K 3.7  CL 106  CO2 24  GLUCOSE 122*  BUN 21  CREATININE 1.41*  CALCIUM 8.7   PT/INR No results found for this basename: LABPROT, INR,  in the last 72 hours ABG No results found for this basename: PHART, PCO2, PO2, HCO3,  in the last 72 hours  Studies/Results: Dg Chest 2 View  01/03/2013   CLINICAL DATA:  Fever. Fatigue.  EXAM: CHEST  2 VIEW  COMPARISON:  None.  FINDINGS: The heart size is within normal limits. Previous CABG noted. Low lung volumes are seen. Diffuse interstitial prominence is likely chronic. Mild linear opacity in the right upper lobe and left lung base is consistent with   scarring or atelectasis. No evidence of pulmonary consolidation or pleural effusion.  IMPRESSION: Low lung volumes and probable chronic interstitial lung disease. Mild right upper and left lower lobe scarring versus atelectasis.   Electronically Signed   By: Joshau  Stahl M.D.   On: 01/03/2013 09:16    Anti-infectives: Anti-infectives   None      No current facility-administered medications for this encounter.   Current Outpatient Prescriptions  Medication Sig Dispense Refill  . albuterol (PROVENTIL HFA;VENTOLIN HFA) 108 (90 BASE) MCG/ACT inhaler Inhale 2 puffs into the lungs 3 (three) times daily. breathing      . aspirin 81 MG chewable tablet Chew 81 mg by mouth daily.      . atorvastatin (LIPITOR) 20 MG tablet Take 20 mg by mouth at bedtime.       . calcium carbonate (TUMS -  DOSED IN MG ELEMENTAL CALCIUM) 500 MG chewable tablet Chew 1 tablet by mouth 2 (two) times daily.      . citalopram (CELEXA) 10 MG tablet Take 15 mg by mouth daily. Takes 1 and 1/2 tablet      . mirtazapine (REMERON) 15 MG tablet Take 15 mg by mouth at bedtime.      . Multiple Vitamins-Minerals (CEROVITE SENIOR PO) Take 1 capsule by mouth 2 (two) times daily.      . polyethylene glycol (MIRALAX / GLYCOLAX) packet Take 17 g by mouth daily as needed (constipation). Constipation      . Tamsulosin HCl (FLOMAX) 0.4 MG CAPS Take 0.4 mg by mouth daily.      . tiotropium (SPIRIVA) 18 MCG inhalation capsule Place 18 mcg into inhaler and inhale daily.        Procedure:   Initial attempt at foley placement with 16fr coude was unsuccessfull.    Cystoscopy was done and he was found to have a dense prostatic stricture.  A wire was successfully passed and he was dilated to 22fr with goodwin sounds over the wire.   A 16fr council foley was placed over the wire.  The balloon was filled with 10cc and he returned clear urine.   The catheter was placed to drainage after a culture was collected.   Assessment: Acute urinary retention with sepsis and a prostatic post radiation urethral stricture.  Plan: Leave foley for now. Check PSA to make sure he doesn't have recurrent cancer as the cause. I will follow.   CC: Dr. S. Goldston   LOS: 0 days    Novalyn Lajara,Brandon J 01/03/2013  

## 2013-01-03 NOTE — Progress Notes (Signed)
Clinical Social Work Department BRIEF PSYCHOSOCIAL ASSESSMENT 01/03/2013  Patient:  MACON, SANDIFORD     Account Number:  [id number]     Admit date:  01/03/2013  Clinical Social Worker:  Mariann Laster  Date/Time:  01/03/2013 04:42 PM  Referred by:  CSW  Date Referred:  01/03/2013 Referred for  ALF Placement   Other Referral:   Interview type:  Patient Other interview type:   Met with pt and daughter Shawna Orleans) at bedside.    PSYCHOSOCIAL DATA Living Status:  FACILITY Admitted from facility:  [facility] Level of care:  Assisted Living Primary support name:  Oziel Beitler Primary support relationship to patient:  CHILD, ADULT Degree of support available:   strong.    CURRENT CONCERNS Current Concerns  Post-Acute Placement   Other Concerns:    SOCIAL WORK ASSESSMENT / PLAN CSW met with pt and pt's daughter at bedside. Daughter is HCPOA. Pt unable to have focused conversation and fell asleep during meeting, so CSW primarily spoke with daughter. Daughter reports that pt would like to return to [facility]. CSW provided brief overview of SNF process b/c pt likely to be admitted and possiblity of surgery. Daughter became teary during meeting, mentioning that her son was currently having a mental health crisis. Daugther reported that pt's other daughter, Saundra Shelling, will likely be involved in pt's care as well, since Shawna Orleans also has to attend to son. CSW provided supportive counseling and answered daughter's questions.   Assessment/plan status:  Psychosocial Support/Ongoing Assessment of Needs Other assessment/ plan:   Information/referral to community resources:   SNF list  Advanced Directive/HCPOA packet    PATIENT'S/FAMILY'S RESPONSE TO PLAN OF CARE: Daughter thanked CSW for overview of SNF process. Daughter became teary during interview, when speaking of her son. Without CSW prompting, daughter asked if CSW had MH advanced directive forms. CSW provided  these forms, along with rest of Advanced Directive packet. Daughter thanked CSW for supportive listening. Daughter appeared somewhat calmer (not crying, not trembling) at end of interview.       York Spaniel [place], [phone]     ED CSW

## 2013-01-03 NOTE — H&P (Addendum)
[facility] History and Physical  Ruben Morrison UJW:[id number] DOB: [birth year] DOA: 01/03/2013  Referring physician: Dr Criss Alvine PCP: No primary provider on file.  Specialists: none  Chief Complaint: Fever and Difficulty arousing this a.m.  HPI: Ruben Morrison is a 79 y.o. male resident of a [facility] memory care unit with history of dementia, prostate cancer or renal insufficiency, hypertension and CAD who presents with above complaints. The history is obtained from her daughter at the bedside. It is reported that patient had fever 101 last PM and also was complaining of lower abdominal pain. This a.m. when she went to wake him up he was difficult to arouse/decreased responsiveness and so he was brought to the ED. In the ED a CT scan of brain was negative for acute intracranial abnormality, and CT of abdomen and pelvis showed mild hydronephrosis with 2 calculi in left renal pelvis up to 7 mm and no evidence of recurrent or metastatic cancer within the abdomen and pelvis,UA in EDP was c/w UTI. In the EDP he was found to be febrile temperature 100.6, and he was noted to be retaining urine, and attempts to place a Foley catheter was unsuccessful and urology was consulted>> cysto done and foley placed and [facility] consulted for admission.    Review of Systems: The patient denies anorexiar, weight loss,, vision loss, decreased hearing, hoarseness, chest pain, syncope, peripheral edema, balance deficits, hemoptysis melena, hematochezia, severe indigestion/heartburn, hematuria, suspicious skin lesions, transient blindness, difficulty walking.  Past Medical History  Diagnosis Date  . Dementia   . Renal disorder   . Pneumococcal pneumonia   . Hypertension   . Encephalopathy acute   . Prostate cancer   . Coronary artery disease    Past Surgical History  Procedure Laterality Date  . Cardiac surgery    . Insertion prostate radiation seed    . Inguinal hernia repair    . Eye surgery       cataract   Social History:  reports that he has never smoked. He does not have any smokeless tobacco history on file. He reports that he does not drink alcohol or use illicit drugs.  where does patient live-memory care unit   Allergies  Allergen Reactions  . Avelox [Moxifloxacin]     Reaction:Unknown Per medication MAR  . Ciprofloxacin     Unknown reaction- per MAR  . Floxin [Ofloxacin]     Unknown reaction- per [facility]   Family history and-his mother died of ovarian cancer father had diabetes mellitus  Prior to Admission medications   Medication Sig Start Date End Date Taking? Authorizing Provider  albuterol (PROVENTIL HFA;VENTOLIN HFA) 108 (90 BASE) MCG/ACT inhaler Inhale 2 puffs into the lungs 3 (three) times daily. breathing   Yes Historical Provider, MD  aspirin 81 MG chewable tablet Chew 81 mg by mouth daily.   Yes Historical Provider, MD  atorvastatin (LIPITOR) 20 MG tablet Take 20 mg by mouth at bedtime.    Yes Historical Provider, MD  calcium carbonate (TUMS - DOSED IN MG ELEMENTAL CALCIUM) 500 MG chewable tablet Chew 1 tablet by mouth 2 (two) times daily.   Yes Historical Provider, MD  citalopram (CELEXA) 10 MG tablet Take 15 mg by mouth daily. Takes 1 and 1/2 tablet   Yes Historical Provider, MD  mirtazapine (REMERON) 15 MG tablet Take 15 mg by mouth at bedtime.   Yes Historical Provider, MD  Multiple Vitamins-Minerals (CEROVITE SENIOR PO) Take 1 capsule by mouth 2 (two) times daily.   Yes Historical Provider,  MD  polyethylene glycol (MIRALAX / GLYCOLAX) packet Take 17 g by mouth daily as needed (constipation). Constipation   Yes Historical Provider, MD  Tamsulosin HCl (FLOMAX) 0.4 MG CAPS Take 0.4 mg by mouth daily.   Yes Historical Provider, MD  tiotropium (SPIRIVA) 18 MCG inhalation capsule Place 18 mcg into inhaler and inhale daily.    Yes Historical Provider, MD   Physical Exam: Filed Vitals:   01/03/13 1728  BP: 135/61  Pulse: 70  Temp: 98.2 F (36.8 C)  Resp: 18     Constitutional: Vital signs reviewed.  Patient is a well-developed and well-nourished in no acute distress and cooperative with exam. Alert and oriented to self.  Head: Normocephalic and atraumatic Nose: No erythema or drainage noted.  Turbinates normal Mouth: no erythema or exudates, MMM Eyes: PERRL, EOMI, conjunctivae normal, No scleral icterus.  Neck: Supple, Trachea midline normal ROM, No JVD, mass, thyromegaly, or carotid bruit present.  Cardiovascular: RRR, S1 normal, S2 normal, no MRG, pulses symmetric and intact bilaterally Pulmonary/Chest: normal respiratory effort, CTAB, no wheezes, rales, or rhonchi Abdominal: Soft. . Tenderness present, no rebound, non-distended, bowel sounds are normal, no masses, organomegaly, or guarding present.  Extremities-no cyanosis and no edema  Neurological: A&O oriented to self, Strength is normal and symmetric bilaterally, cranial nerve II-XII are grossly intact, no focal motor deficit, sensory intact to light touch bilaterally.  Skin: Warm, dry and intact. No rash, cyanosis, or clubbing.  Psychiatric: Normal mood and affect.   Labs on Admission:  Basic Metabolic Panel:  Recent Labs Lab 01/03/13 0929  NA 139  K 3.7  CL 106  CO2 24  GLUCOSE 122*  BUN 21  CREATININE 1.41*  CALCIUM 8.7   Liver Function Tests:  Recent Labs Lab 01/03/13 0929  AST 20  ALT 15  ALKPHOS 104  BILITOT 0.8  PROT 6.0  ALBUMIN 3.1*    Recent Labs Lab 01/03/13 0929  LIPASE 52   No results found for this basename: AMMONIA,  in the last 168 hours CBC:  Recent Labs Lab 01/03/13 0929  WBC 10.8*  NEUTROABS 7.9*  HGB 13.3  HCT 38.7*  MCV 98.5  PLT 194   Cardiac Enzymes: No results found for this basename: CKTOTAL, CKMB, CKMBINDEX, TROPONINI,  in the last 168 hours  BNP (last 3 results) No results found for this basename: PROBNP,  in the last 8760 hours CBG: No results found for this basename: GLUCAP,  in the last 168 hours  Radiological  Exams on Admission: Ct Abdomen Pelvis Wo Contrast  01/03/2013   CLINICAL DATA:  Abdominal pain. Fever. Urosepsis. Prostate carcinoma.  EXAM: CT ABDOMEN AND PELVIS WITHOUT CONTRAST  TECHNIQUE: Multidetector CT imaging of the abdomen and pelvis was performed following the standard protocol without intravenous contrast.  COMPARISON:  None.  FINDINGS: Chronic bibasilar pulmonary interstitial fibrosis noted. Small loculated pericardial effusion seen along the base of the heart. Small calcified gallstones are seen however there is no evidence of cholecystitis or biliary dilatation. Noncontrast images of the liver, spleen, pancreas, and adrenal glands are normal in appearance.  Small bilateral intrarenal calculi are seen. Mild left hydronephrosis noted, with 2 calculi seen in the left renal pelvis, largest measuring 7 mm. No evidence of ureteral calculi or dilatation. Foley catheter is seen within the bladder which is decompressed. Brachytherapy seeds seen throughout the prostate bed.  No pelvic or abdominal lymphadenopathy identified. No other soft tissue masses identified within the abdomen or pelvis.  Diverticulosis is seen mainly involving  the sigmoid colon, however there is no evidence of diverticulitis. No other inflammatory process or abnormal fluid collections are identified. No evidence of hernia or dilated bowel loops. No suspicious bone lesions identified.  IMPRESSION: No evidence of recurrent or metastatic carcinoma within the abdomen or pelvis.  Mild hydronephrosis, with 2 calculi in left renal pelvis measuring up to 7 mm. Nonobstructive bilateral nephrolithiasis also noted.  Cholelithiasis.  No radiographic evidence of cholecystitis.  Diverticulosis. No radiographic evidence of diverticulitis.   Electronically Signed   By: Myles Rosenthal M.D.   On: 01/03/2013 16:22   Dg Chest 2 View  01/03/2013   CLINICAL DATA:  Fever. Fatigue.  EXAM: CHEST  2 VIEW  COMPARISON:  None.  FINDINGS: The heart size is within  normal limits. Previous CABG noted. Low lung volumes are seen. Diffuse interstitial prominence is likely chronic. Mild linear opacity in the right upper lobe and left lung base is consistent with scarring or atelectasis. No evidence of pulmonary consolidation or pleural effusion.  IMPRESSION: Low lung volumes and probable chronic interstitial lung disease. Mild right upper and left lower lobe scarring versus atelectasis.   Electronically Signed   By: Myles Rosenthal M.D.   On: 01/03/2013 09:16   Ct Head Wo Contrast  01/03/2013   CLINICAL DATA:  Initial encounter for patient who was unresponsive earlier and has history of dementia.  EXAM: CT HEAD WITHOUT CONTRAST  TECHNIQUE: Contiguous axial images were obtained from the base of the skull through the vertex without intravenous contrast.  COMPARISON:  Multiple prior examinations dating back to 02/20/2012, most recently 12/16/2012.  FINDINGS: Severe cortical and deep atrophy and moderate cerebellar atrophy, unchanged. Moderate changes of small vessel disease of the white matter diffusely, unchanged. No mass lesion. No midline shift. No acute hemorrhage or hematoma. No extra-axial fluid collections. No evidence of acute infarction.  No focal osseous abnormality involving the skull. Visualized paranasal sinuses, bilateral mastoid air cells, and bilateral middle ear cavities well-aerated. Extensive bilateral carotid siphon atherosclerosis.  IMPRESSION: 1. No acute intracranial abnormality. 2. Stable severe generalized atrophy and moderate chronic microvascular ischemic changes of the white matter. 3. Extensive bilateral carotid siphon atherosclerosis.   Electronically Signed   By: Hulan Saas M.D.   On: 01/03/2013 16:18      Assessment/Plan Active Problems:  Present on Admission:  . Urethral stricture unspecified -As discussed above, urology was consulted in the ED and patient seemed) and cystoscopy was done and he was found to have a dense prostatic  stricture -Foley catheter was placed, PSA obtained to evaluate for recurrent cancer as etiology>> follow. . Acute urinary retention -Secondary to above, status post Foley placement per urology. -Continue Flomax  . Urinary tract infection, site not specified -Likely secondary to above, will start on empiric antibiotics  -Will urine cultures and further treat accordingly.  Marland Kitchen acute delirium on Dementia -Delirium/encephalopathy likely precipitated by UTI  -Treat with empiric antibiotics as above and follow.  . Renal calculi with mild hydronephrosis  -Per urology, status post Foley placement. .H/O Hypertension -On no antihypertensives, monitor and treat as appropriate.  . h/oProstate cancer -PSA level, Urology following  . CAD -Status post CABG in the past, he is chest pain-free -Continue aspirin and Lipitor  . h/o CKD. -Creatinine stable, follow.     Code Status: full Family Communication: .daughter At the bedside Disposition Plan: admit for observation  Time spent:>72mins  Kela Millin [facility] Pager [phone]  If 7PM-7AM, please contact night-coverage [email or web address] Password [facility] 01/03/2013,  7:06 PM

## 2013-01-03 NOTE — Progress Notes (Signed)
CSW went to meet with pt because he is from facility [facility]).   CSW met with pt and pt daughter Teacher, music) at bedside. Melody is HCPOA.  CSW inquired whether, when/if time comes, if pt would like to return to facility. Pt had fallen asleep, and daughter reports that yes, pt would. Because unclear whether pt will be admitted to hospital, CSW provided brief overview of SNF process and provided daughter with list of SNFs.   Daughter told CSW that likely pt's other daughter Saundra Shelling) would come shortly. Melody reported that her son was having a mental health crisis and so she would likely need to leave to attend to that.At this, Melody began to cry, and CSW offered supportive counseling.  Per daughter request, CSW provided Advance Directive/POA/MH directive packet to daughter.  York Spaniel [place], [phone]     ED CSW  1:30pm

## 2013-01-03 NOTE — ED Notes (Addendum)
Pt from [facility]. Night staff noted that pt was more lethargic than usual. Pt warm to touch. Pt alert and oriented upon arrival to ED. Denies cough, sore throat, body aches, v/d or dysuria. Pt had temp of 100.6 upon arrival

## 2013-01-04 DIAGNOSIS — I2510 Atherosclerotic heart disease of native coronary artery without angina pectoris: Secondary | ICD-10-CM

## 2013-01-04 LAB — BASIC METABOLIC PANEL
BUN: 18 mg/dL (ref 6–23)
Creatinine, Ser: 1.15 mg/dL (ref 0.50–1.35)
GFR calc Af Amer: 65 mL/min — ABNORMAL LOW (ref >90)
GFR calc non Af Amer: 56 mL/min — ABNORMAL LOW (ref >90)
Potassium: 3.6 mEq/L (ref 3.5–5.1)
Sodium: 137 mEq/L (ref 135–145)

## 2013-01-04 LAB — URINE CULTURE: Culture: NO GROWTH

## 2013-01-04 LAB — CBC
Hemoglobin: 11.9 g/dL — ABNORMAL LOW (ref 13.0–17.0)
MCHC: 33.9 g/dL (ref 30.0–36.0)
RDW: 14.3 % (ref 11.5–15.5)
WBC: 9.4 10*3/uL (ref 4.0–10.5)

## 2013-01-04 LAB — MRSA PCR SCREENING: MRSA by PCR: NEGATIVE

## 2013-01-04 NOTE — Progress Notes (Signed)
Patient ID: Ruben Morrison, male   DOB: 08/21/1925, 79 y.o.   MRN: [id number]  I have reviewed his CT and he has a 7mm non-obstructing left proximal ureteral stone and a non-obstructing renal stone.   Once he has recovered from the UTI, we will need to consider treatment of the stone.  Lithotripsy will be most appropriate for this particular stone.

## 2013-01-04 NOTE — Op Note (Signed)
NAME:  Ruben Morrison, Ruben Morrison NO.:  [id number]  MEDICAL RECORD NO.:  [id number]  LOCATION:  1417                         FACILITY:  [facility]  PHYSICIAN:  Excell Seltzer. Annabell Howells, M.D.    DATE OF BIRTH:  1925-08-22  DATE OF PROCEDURE:  01/03/2013 DATE OF DISCHARGE:                              OPERATIVE REPORT   PROCEDURE:  Cystoscopy with urethral dilation and catheter placement.  PREOPERATIVE DIAGNOSIS:  Difficult Foley placement.  POSTOPERATIVE DIAGNOSIS:  Prostatic urethral stricture with prior prostate radiation.  SURGEON:  Eitan Doubleday. Annabell Howells, MD  ANESTHESIA:  None.  COMPLICATIONS:  None.  INDICATIONS:  [name] is an 79 year old white male, who was in the ER with urinary retention and sepsis.  He has a history of prostate cancer and a seed implantation remotely.  Attempts at Foley catheter placement were unsuccessful.  FINDINGS OF PROCEDURE:  He was placed supine on the ER stretcher.  He had been prepped with Betadine solution and draped with sterile towels. A 16-French scope was passed with lubrication.  Inspection revealed a normal anterior and bulbar urethra.  The external sphincter was intact, but there was stricturing just proximal to the stents and did not allow passage of the scope, however, I was able to pass a guidewire through the scope into the bladder.  The prostatic urethra was then dilated to 22-French with Geraldo Pitter slipped over sounds.  Once this was done, a 16-French Councill catheter was passed into the bladder.  The balloon was filled with 10 mL of sterile fluid.  The wire was removed.  The urine was clear on return.  Specimen was obtained for culture, and he was placed to straight drainage.  There were no complications.     Excell Seltzer. Annabell Howells, M.D.     JJW/MEDQ  D:  01/03/2013  T:  01/04/2013  Job:  [id number]

## 2013-01-04 NOTE — Care Management Note (Addendum)
    Page 1 of 1   01/08/2013     12:23:58 PM   CARE MANAGEMENT NOTE 01/08/2013  Patient:  Ruben Morrison, Ruben Morrison   Account Number:  [id number]  Date Initiated:  01/04/2013  Documentation initiated by:  Lanier Clam  Subjective/Objective Assessment:   79 Y/O M ADMITTED W/URETHRAL STRICTURE.     Action/Plan:   FROM HOME.   Anticipated DC Date:  01/08/2013   Anticipated DC Plan:  SKILLED NURSING FACILITY      DC Planning Services  CM consult      Choice offered to / List presented to:             Status of service:  Completed, signed off Medicare Important Message given?   (If response is "NO", the following Medicare IM given date fields will be blank) Date Medicare IM given:   Date Additional Medicare IM given:    Discharge Disposition:  SKILLED NURSING FACILITY  Per UR Regulation:  Reviewed for med. necessity/level of care/duration of stay  If discussed at Long Length of Stay Meetings, dates discussed:    Comments:  01/08/13 Ayleen Mckinstry RN,BSN NCM [phone] FOR D/C SNF.AWAITING AUTH.  01/07/13 Alaura Schippers RN,BSN NCM [phone] NOTED [facility] MAY NOT BE ABLE TO PROVIDE LEVEL OF CARE.FAXED OUT TO SNF.  01/05/13 Morgen Ritacco RN,BSN NCM [phone] SPOKE TO [facility],THEY CAN PROVIDE SERVICES,& THEY HAVE THEIR OWN CONTRACTED HHC AGENCY.  01/04/13 Kent Braunschweig RN,BSN NCM [phone] FOR D/C [facility].

## 2013-01-04 NOTE — Progress Notes (Signed)
Patient ID: Ruben Morrison, male   DOB: 1925-03-24, 79 y.o.   MRN: [id number] [facility] PROGRESS NOTE  Draden Cottingham NFA:[id number] DOB: Aug 13, 1925 DOA: 01/03/2013 PCP: No primary provider on file.  Brief narrative: 79 y.o. male resident of a [facility] memory care unit with history of dementia, prostate cancer and renal insufficiency, hypertension and CAD who presented to [facility] ED with main concern of progressively worsening lower quadrants abdominal pain. History on admission was provided by daughter as pt unable to provide history due to dementia. Daughter explained that pt appeared to have discomfort for several days and the am prior to admission he was more lethargic and difficult to arouse. He was brought to ED and CT scan of the abdomen and pelvis showed mild hydronephrosis with 2 calculi in left renal pelvis up to 7 mm and no evidence of recurrent or metastatic cancer within the abdomen and pelvis, UA checked in ED and was c/w UTI, pt also found to be febrile with Tmax 101 F.  Assessment/Plan:  Principal Problem:   Urethral stricture, prostatic stricture  - appreciate urology assistance - pt is now status post Cystoscopy with urethral dilation and catheter placement, post op day #1 - pt clinically stable - continue supportive care and ABX for UTI treatment  Active Problems:   Urinary tract infection - continue Rocephin day #2 - follow up on urine culture    Acute urinary retention - secondary to principal problem - monitor urine output    Leukocytosis - secondary to UTI, will continue ABX as noted above - WBC is within normal limits this AM   Acute renal failure - secondary to principal problem and UTI - creatinine is now within normal limits    Dementia - will be discharged to memory unit center once medically ready    Hypertension - on soft side, monitor vitals per floor protocol    Prostate cancer - no evidence of recurrent cancer per CT scan   Code Status:  Full Family Communication: No family at bedside  Disposition Plan: Remains inpatient   Manson Passey, MD  [facility] Pager [phone]  If 7PM-7AM, please contact night-coverage [email or web address] Password [email or web address] 01/04/2013, 8:50 AM   LOS: 1 day   Consultants:  Urology   Procedures: 01/03/2013 --> Cystoscopy with urethral dilation and catheter placement. (Dr. Annabell Howells) POSTOPERATIVE DIAGNOSIS: Prostatic urethral stricture with prior prostate radiation.  Ct Abdomen Pelvis Wo Contrast   01/03/2013    No evidence of recurrent or metastatic carcinoma within the abdomen or pelvis.  Mild hydronephrosis, with 2 calculi in left renal pelvis measuring up to 7 mm. Nonobstructive bilateral nephrolithiasis also noted.  Cholelithiasis.  No radiographic evidence of cholecystitis.  Diverticulosis. No radiographic evidence of diverticulitis.    Dg Chest 2 View   01/03/2013     Low lung volumes and probable chronic interstitial lung disease. Mild right upper and left lower lobe scarring versus atelectasis.     Ct Head Wo Contrast  01/03/2013   1. No acute intracranial abnormality.  2. Stable severe generalized atrophy and moderate chronic microvascular ischemic changes of the white matter.  3. Extensive bilateral carotid siphon atherosclerosis.     Antibiotics:  Rocephin 11/02 -->   HPI/Subjective: No events overnight.   Objective: Filed Vitals:   01/03/13 2116 01/03/13 2200 01/04/13 0545 01/04/13 0828  BP:  93/55 90/46   Pulse:  68 55   Temp:  98.6 F (37 C) 98.2 F (36.8 C)   TempSrc:  Oral Oral  Resp:  20 20   Height:      Weight:      SpO2: 97% 100% 97% 97%    Intake/Output Summary (Last 24 hours) at 01/04/13 0850 Last data filed at 01/03/13 2113  Gross per 24 hour  Intake    363 ml  Output      0 ml  Net    363 ml    Exam:   General:  Pt is alert, follows some commands appropriately, not in acute distress  Cardiovascular: Regular rate and rhythm, S1/S2, no murmurs, no  rubs, no gallops  Respiratory: Clear to auscultation bilaterally, no wheezing, diminished breath sounds at bases   Abdomen: Soft, non tender, non distended, bowel sounds present, no guarding  Extremities: No edema, pulses DP and PT palpable bilaterally  Data Reviewed: Basic Metabolic Panel:  Recent Labs Lab 01/03/13 0929 01/04/13 0458  NA 139 137  K 3.7 3.6  CL 106 107  CO2 24 22  GLUCOSE 122* 88  BUN 21 18  CREATININE 1.41* 1.15  CALCIUM 8.7 8.0*   Liver Function Tests:  Recent Labs Lab 01/03/13 0929  AST 20  ALT 15  ALKPHOS 104  BILITOT 0.8  PROT 6.0  ALBUMIN 3.1*    Recent Labs Lab 01/03/13 0929  LIPASE 52   CBC:  Recent Labs Lab 01/03/13 0929 01/04/13 0458  WBC 10.8* 9.4  NEUTROABS 7.9*  --   HGB 13.3 11.9*  HCT 38.7* 35.1*  MCV 98.5 99.2  PLT 194 168   Recent Results (from the past 240 hour(s))  MRSA PCR SCREENING     Status: None   Collection Time    01/04/13  2:02 AM      Result Value Range Status   MRSA by PCR NEGATIVE  NEGATIVE Final   Comment:            The GeneXpert MRSA Assay (FDA     approved for NASAL specimens     only), is one component of a     comprehensive MRSA colonization     surveillance program. It is not     intended to diagnose MRSA     infection nor to guide or     monitor treatment for     MRSA infections.    Scheduled Meds: . albuterol  2 puff Inhalation TID  . aspirin  81 mg Oral Daily  . atorvastatin  20 mg Oral QHS  . calcium carbonate  1 tablet Oral BID  . cefTRIAXone  IV  1 g Intravenous Q24H  . citalopram  15 mg Oral Daily  . enoxaparin  injection  40 mg Subcutaneous Q24H  . mirtazapine  15 mg Oral QHS  . tamsulosin  0.4 mg Oral Daily  . tiotropium  18 mcg Inhalation Daily   Continuous Infusions: . sodium chloride 75 mL/hr at 01/03/13 2112

## 2013-01-05 LAB — CBC
HCT: 34.7 % — ABNORMAL LOW (ref 39.0–52.0)
MCHC: 33.7 g/dL (ref 30.0–36.0)
MCV: 98 fL (ref 78.0–100.0)
RBC: 3.54 MIL/uL — ABNORMAL LOW (ref 4.22–5.81)
RDW: 14.2 % (ref 11.5–15.5)
WBC: 8.7 10*3/uL (ref 4.0–10.5)

## 2013-01-05 LAB — URINE CULTURE: Colony Count: NO GROWTH

## 2013-01-05 LAB — BASIC METABOLIC PANEL
BUN: 16 mg/dL (ref 6–23)
CO2: 21 mEq/L (ref 19–32)
Chloride: 111 mEq/L (ref 96–112)
Creatinine, Ser: 1.18 mg/dL (ref 0.50–1.35)
Potassium: 3.6 mEq/L (ref 3.5–5.1)
Sodium: 141 mEq/L (ref 135–145)

## 2013-01-05 NOTE — Progress Notes (Signed)
Patient ID: Ruben Morrison, male   DOB: 1926/01/17, 79 y.o.   MRN: [id number]  [facility] PROGRESS NOTE  Ruben Morrison WUX:[id number] DOB: 04-22-1925 DOA: 01/03/2013 PCP: No primary provider on file.  Brief narrative: 79 y.o. male resident of a [facility] care unit with history of dementia, prostate cancer and renal insufficiency, hypertension and CAD who presented to [facility] ED with main concern of progressively worsening lower quadrants abdominal pain. History on admission was provided by daughter as pt unable to provide history due to dementia. Daughter explained that pt appeared to have discomfort for several days and the am prior to admission he was more lethargic and difficult to arouse. He was brought to ED and CT scan of the abdomen and pelvis showed mild hydronephrosis with 2 calculi in left renal pelvis up to 7 mm and no evidence of recurrent or metastatic cancer within the abdomen and pelvis, UA checked in ED and was c/w UTI, pt also found to be febrile with Tmax 101 F.   Assessment/Plan:  Principal Problem:  Urethral stricture, prostatic stricture  - appreciate urology assistance  - pt is now status post Cystoscopy with urethral dilation and catheter placement, post op day #2 - pt clinically stable  - continue supportive care and ABX for UTI treatment  Active Problems:  Urinary tract infection  - continue Rocephin day #3, plan on transitioning to oral ABX in AM - initial urine culture negative, repeat one pending  Acute urinary retention  - secondary to principal problem  - monitor urine output  Leukocytosis  - secondary to UTI, will continue ABX as noted above  - WBC is within normal limits this AM  Acute renal failure  - secondary to principal problem and UTI  - creatinine is now within normal limits  Dementia  - will be discharged to memory unit center once medically ready and cleared by urology team  Hypertension  - on soft side, monitor vitals per floor  protocol  Prostate cancer  - no evidence of recurrent cancer per CT scan   Code Status: Full  Family Communication: No family at bedside  Disposition Plan: Remains inpatient, d/c once urology team clears for discharge    Consultants:  Urology   Procedures:  01/03/2013 --> Cystoscopy with urethral dilation and catheter placement. (Dr. Annabell Howells)  POSTOPERATIVE DIAGNOSIS: Prostatic urethral stricture with prior prostate radiation.  Ct Abdomen Pelvis Wo Contrast 01/03/2013  No evidence of recurrent or metastatic carcinoma within the abdomen or pelvis. Mild hydronephrosis, with 2 calculi in left renal pelvis measuring up to 7 mm. Nonobstructive bilateral nephrolithiasis also noted. Cholelithiasis. No radiographic evidence of cholecystitis. Diverticulosis. No radiographic evidence of diverticulitis.  Dg Chest 2 View 01/03/2013  Low lung volumes and probable chronic interstitial lung disease. Mild right upper and left lower lobe scarring versus atelectasis.  Ct Head Wo Contrast 01/03/2013  1. No acute intracranial abnormality.  2. Stable severe generalized atrophy and moderate chronic microvascular ischemic changes of the white matter.  3. Extensive bilateral carotid siphon atherosclerosis.   Antibiotics:  Rocephin 11/02 -->   Manson Passey, MD  [facility] Pager [phone]  If 7PM-7AM, please contact night-coverage [email or web address] Password [facility] 01/05/2013, 9:39 AM   LOS: 2 days   HPI/Subjective: No events overnight. PT denies chest pain.  Objective: Filed Vitals:   01/04/13 0828 01/04/13 1342 01/04/13 2049 01/05/13 0615  BP:  91/43 120/50 111/62  Pulse:  64 73 56  Temp:  98.4 F (36.9 C) 98.6 F (37 C)  98 F (36.7 C)  TempSrc:  Oral Oral Oral  Resp:  18 20 18   Height:      Weight:      SpO2: 97% 97% 98% 97%    Intake/Output Summary (Last 24 hours) at 01/05/13 4540 Last data filed at 01/05/13 9811  Gross per 24 hour  Intake   1355 ml  Output   1500 ml  Net   -145 ml     Exam:   General:  Pt is alert, follows some commands appropriately, not in acute distress  Cardiovascular: Regular rhythm, bradycardic, S1/S2, no murmurs, no rubs, no gallops  Respiratory: Clear to auscultation bilaterally, no wheezing, no crackles, no rhonchi  Abdomen: Soft, non tender, non distended, bowel sounds present, no guarding  Extremities: No edema, pulses DP and PT palpable bilaterally  Data Reviewed: Basic Metabolic Panel:  Recent Labs Lab 01/03/13 0929 01/04/13 0458 01/05/13 0515  NA 139 137 141  K 3.7 3.6 3.6  CL 106 107 111  CO2 24 22 21   GLUCOSE 122* 88 93  BUN 21 18 16   CREATININE 1.41* 1.15 1.18  CALCIUM 8.7 8.0* 8.4   Liver Function Tests:  Recent Labs Lab 01/03/13 0929  AST 20  ALT 15  ALKPHOS 104  BILITOT 0.8  PROT 6.0  ALBUMIN 3.1*    Recent Labs Lab 01/03/13 0929  LIPASE 52   CBC:  Recent Labs Lab 01/03/13 0929 01/04/13 0458 01/05/13 0515  WBC 10.8* 9.4 8.7  NEUTROABS 7.9*  --   --   HGB 13.3 11.9* 11.7*  HCT 38.7* 35.1* 34.7*  MCV 98.5 99.2 98.0  PLT 194 168 170   CBG: No results found for this basename: GLUCAP,  in the last 168 hours  Recent Results (from the past 240 hour(s))  URINE CULTURE     Status: None   Collection Time    01/03/13  2:00 PM      Result Value Range Status   Specimen Description URINE, CATHETERIZED   Final   Special Requests NONE   Final   Culture  Setup Time     Final   Value: 01/03/2013 21:22     Performed at [facility] Count     Final   Value: NO GROWTH     Performed at [facility]   Culture     Final   Value: NO GROWTH     Performed at [facility]   Report Status 01/04/2013 FINAL   Final  MRSA PCR SCREENING     Status: None   Collection Time    01/04/13  2:02 AM      Result Value Range Status   MRSA by PCR NEGATIVE  NEGATIVE Final   Comment:            The GeneXpert MRSA Assay (FDA     approved for NASAL specimens     only), is one  component of a     comprehensive MRSA colonization     surveillance program. It is not     intended to diagnose MRSA     infection nor to guide or     monitor treatment for     MRSA infections.     Scheduled Meds: . albuterol  2 puff Inhalation TID  . aspirin  81 mg Oral Daily  . atorvastatin  20 mg Oral QHS  . calcium carbonate  1 tablet Oral BID  . cefTRIAXone (ROCEPHIN)  IV  1 g Intravenous Q24H  . citalopram  15 mg Oral Daily  . enoxaparin (LOVENOX) injection  40 mg Subcutaneous Q24H  . mirtazapine  15 mg Oral QHS  . sodium chloride  3 mL Intravenous Q12H  . tamsulosin  0.4 mg Oral Daily  . tiotropium  18 mcg Inhalation Daily   Continuous Infusions: . sodium chloride 75 mL/hr at 01/04/13 1034

## 2013-01-05 NOTE — Progress Notes (Signed)
Patient ID: Ruben Morrison, male   DOB: 1926-01-07, 79 y.o.   MRN: [id number]    Subjective: Ruben Morrison is resting comfortably but arousable and voices no complaints.   His urine is clear.    ROS: He specifically denies flank pain.   Objective: Vital signs in last 24 hours: Temp:  [98 F (36.7 C)-98.6 F (37 C)] 98 F (36.7 C) (11/04 0615) Pulse Rate:  [56-73] 56 (11/04 0615) Resp:  [18-20] 18 (11/04 0615) BP: (91-120)/(43-62) 111/62 mmHg (11/04 0615) SpO2:  [97 %-98 %] 97 % (11/04 0615)  Intake/Output from previous day: 11/03 0701 - 11/04 0700 In: 1595 [P.O.:720; I.V.:875] Out: 1500 [Urine:1500] Intake/Output this shift:    General appearance: no distress and somewhat sedated GI: soft with mild diffuse tenderness that doesn't seem to be worse in the LUQ or flank  Lab Results:   Recent Labs  01/04/13 0458 01/05/13 0515  WBC 9.4 8.7  HGB 11.9* 11.7*  HCT 35.1* 34.7*  PLT 168 170   BMET  Recent Labs  01/04/13 0458 01/05/13 0515  NA 137 141  K 3.6 3.6  CL 107 111  CO2 22 21  GLUCOSE 88 93  BUN 18 16  CREATININE 1.15 1.18  CALCIUM 8.0* 8.4   PT/INR No results found for this basename: LABPROT, INR,  in the last 72 hours ABG No results found for this basename: PHART, PCO2, PO2, HCO3,  in the last 72 hours  Studies/Results: Ct Abdomen Pelvis Wo Contrast  01/03/2013   CLINICAL DATA:  Abdominal pain. Fever. Urosepsis. Prostate carcinoma.  EXAM: CT ABDOMEN AND PELVIS WITHOUT CONTRAST  TECHNIQUE: Multidetector CT imaging of the abdomen and pelvis was performed following the standard protocol without intravenous contrast.  COMPARISON:  None.  FINDINGS: Chronic bibasilar pulmonary interstitial fibrosis noted. Small loculated pericardial effusion seen along the base of the heart. Small calcified gallstones are seen however there is no evidence of cholecystitis or biliary dilatation. Noncontrast images of the liver, spleen, pancreas, and adrenal glands are normal in  appearance.  Small bilateral intrarenal calculi are seen. Mild left hydronephrosis noted, with 2 calculi seen in the left renal pelvis, largest measuring 7 mm. No evidence of ureteral calculi or dilatation. Foley catheter is seen within the bladder which is decompressed. Brachytherapy seeds seen throughout the prostate bed.  No pelvic or abdominal lymphadenopathy identified. No other soft tissue masses identified within the abdomen or pelvis.  Diverticulosis is seen mainly involving the sigmoid colon, however there is no evidence of diverticulitis. No other inflammatory process or abnormal fluid collections are identified. No evidence of hernia or dilated bowel loops. No suspicious bone lesions identified.  IMPRESSION: No evidence of recurrent or metastatic carcinoma within the abdomen or pelvis.  Mild hydronephrosis, with 2 calculi in left renal pelvis measuring up to 7 mm. Nonobstructive bilateral nephrolithiasis also noted.  Cholelithiasis.  No radiographic evidence of cholecystitis.  Diverticulosis. No radiographic evidence of diverticulitis.   Electronically Signed   By: Myles Rosenthal M.D.   On: 01/03/2013 16:22   Ct Head Wo Contrast  01/03/2013   CLINICAL DATA:  Initial encounter for patient who was unresponsive earlier and has history of dementia.  EXAM: CT HEAD WITHOUT CONTRAST  TECHNIQUE: Contiguous axial images were obtained from the base of the skull through the vertex without intravenous contrast.  COMPARISON:  Multiple prior examinations dating back to 02/20/2012, most recently 12/16/2012.  FINDINGS: Severe cortical and deep atrophy and moderate cerebellar atrophy, unchanged. Moderate changes of small vessel  disease of the white matter diffusely, unchanged. No mass lesion. No midline shift. No acute hemorrhage or hematoma. No extra-axial fluid collections. No evidence of acute infarction.  No focal osseous abnormality involving the skull. Visualized paranasal sinuses, bilateral mastoid air cells, and  bilateral middle ear cavities well-aerated. Extensive bilateral carotid siphon atherosclerosis.  IMPRESSION: 1. No acute intracranial abnormality. 2. Stable severe generalized atrophy and moderate chronic microvascular ischemic changes of the white matter. 3. Extensive bilateral carotid siphon atherosclerosis.   Electronically Signed   By: Hulan Saas M.D.   On: 01/03/2013 16:18    Anti-infectives: Anti-infectives   Start     Dose/Rate Route Frequency Ordered Stop   01/03/13 2000  cefTRIAXone (ROCEPHIN) 1 g in dextrose 5 % 50 mL IVPB     1 g 100 mL/hr over 30 Minutes Intravenous Every 24 hours 01/03/13 1941        Current Facility-Administered Medications  Medication Dose Route Frequency Provider Last Rate Last Dose  . acetaminophen (TYLENOL) tablet 650 mg  650 mg Oral Q6H PRN Adeline C Viyuoh, MD       Or  . acetaminophen (TYLENOL) suppository 650 mg  650 mg Rectal Q6H PRN Adeline C Viyuoh, MD      . albuterol (PROVENTIL HFA;VENTOLIN HFA) 108 (90 BASE) MCG/ACT inhaler 2 puff  2 puff Inhalation TID Kela Millin, MD   2 puff at 01/04/13 2047  . aspirin chewable tablet 81 mg  81 mg Oral Daily Adeline C Viyuoh, MD   81 mg at 01/05/13 0930  . atorvastatin (LIPITOR) tablet 20 mg  20 mg Oral QHS Kela Millin, MD   20 mg at 01/04/13 2027  . calcium carbonate (TUMS - dosed in mg elemental calcium) chewable tablet 200 mg of elemental calcium  1 tablet Oral BID Adeline C Viyuoh, MD   200 mg of elemental calcium at 01/05/13 0930  . cefTRIAXone (ROCEPHIN) 1 g in dextrose 5 % 50 mL IVPB  1 g Intravenous Q24H Kela Millin, MD   1 g at 01/04/13 2027  . citalopram (CELEXA) tablet 15 mg  15 mg Oral Daily Adeline C Viyuoh, MD   15 mg at 01/05/13 0930  . enoxaparin (LOVENOX) injection 40 mg  40 mg Subcutaneous Q24H Kela Millin, MD   40 mg at 01/04/13 2027  . mirtazapine (REMERON) tablet 15 mg  15 mg Oral QHS Kela Millin, MD   15 mg at 01/04/13 2027  . ondansetron (ZOFRAN) tablet 4 mg   4 mg Oral Q6H PRN Adeline C Viyuoh, MD       Or  . ondansetron (ZOFRAN) injection 4 mg  4 mg Intravenous Q6H PRN Adeline C Viyuoh, MD      . oxyCODONE (Oxy IR/ROXICODONE) immediate release tablet 5 mg  5 mg Oral Q6H PRN Adeline C Viyuoh, MD      . polyethylene glycol (MIRALAX / GLYCOLAX) packet 17 g  17 g Oral Daily PRN Adeline C Viyuoh, MD      . sodium chloride 0.9 % injection 3 mL  3 mL Intravenous Q12H Kela Millin, MD   3 mL at 01/03/13 2113  . tamsulosin (FLOMAX) capsule 0.4 mg  0.4 mg Oral Daily Adeline C Viyuoh, MD   0.4 mg at 01/05/13 0930  . tiotropium (SPIRIVA) inhalation capsule 18 mcg  18 mcg Inhalation Daily Kela Millin, MD   18 mcg at 01/04/13 3244   CT scan reviewed.    Urine culture pending.  Assessment: Prostatic urethral stricture with retention now doing well with foley.\ 7mm left proximal stone without obstruction.   Plan: He will need ESWL when more completely recovered from the UTI. He will need anesthesa for MAC since he is demented.    I may try to schedule for next week.      LOS: 2 days    Bjorn Pippin J 01/05/2013

## 2013-01-06 DIAGNOSIS — F0390 Unspecified dementia without behavioral disturbance: Secondary | ICD-10-CM

## 2013-01-06 DIAGNOSIS — N201 Calculus of ureter: Secondary | ICD-10-CM | POA: Diagnosis present

## 2013-01-06 DIAGNOSIS — R338 Other retention of urine: Secondary | ICD-10-CM

## 2013-01-06 DIAGNOSIS — N390 Urinary tract infection, site not specified: Secondary | ICD-10-CM

## 2013-01-06 DIAGNOSIS — I10 Essential (primary) hypertension: Secondary | ICD-10-CM

## 2013-01-06 DIAGNOSIS — N35919 Unspecified urethral stricture, male, unspecified site: Secondary | ICD-10-CM

## 2013-01-06 DIAGNOSIS — C61 Malignant neoplasm of prostate: Secondary | ICD-10-CM

## 2013-01-06 LAB — CBC
HCT: 36.9 % — ABNORMAL LOW (ref 39.0–52.0)
Hemoglobin: 12.5 g/dL — ABNORMAL LOW (ref 13.0–17.0)
MCH: 33.1 pg (ref 26.0–34.0)
MCV: 97.6 fL (ref 78.0–100.0)
Platelets: 187 10*3/uL (ref 150–400)
RBC: 3.78 MIL/uL — ABNORMAL LOW (ref 4.22–5.81)

## 2013-01-06 LAB — BASIC METABOLIC PANEL
CO2: 24 mEq/L (ref 19–32)
Calcium: 8.9 mg/dL (ref 8.4–10.5)
Chloride: 108 mEq/L (ref 96–112)
Creatinine, Ser: 1.32 mg/dL (ref 0.50–1.35)
GFR calc Af Amer: 55 mL/min — ABNORMAL LOW (ref >90)
GFR calc non Af Amer: 47 mL/min — ABNORMAL LOW (ref >90)
Glucose, Bld: 84 mg/dL (ref 70–99)
Sodium: 140 mEq/L (ref 135–145)

## 2013-01-06 NOTE — Progress Notes (Signed)
Patient ID: Ruben Morrison, male   DOB: 1925/06/25, 79 y.o.   MRN: [id number] I am going to try to get him on the schedule for ESWL of the left proximal ureteral stones in 1-2 weeks to give him time to recover from the UTI.  He should keep the foley for the time being.  He will need to be off of ASA for 5 days prior to the procedure and the enoxaprin 12-24 hrs before.  His culture was negative, but I would like for him to remain on at least a daily suppressive dose of an antibiotic such as cipro 250 or trimethroprin 100mg   until he has been treated.

## 2013-01-06 NOTE — Evaluation (Signed)
Physical Therapy Evaluation Patient Details Name: Ruben Morrison MRN: [id number] DOB: June 15, 1925 Today's Date: 01/06/2013 Time: 4098-1191 PT Time Calculation (min): 18 min  PT Assessment / Plan / Recommendation History of Present Illness  Pt is a 79 y.o. male resident of a [facility] memory care unit with history of dementia, prostate cancer or renal insufficiency, hypertension and CAD who presents with above complaints. The history is obtained from her daughter at the bedside. It is reported that patient had fever 101 last PM and also was complaining of lower abdominal pain. This a.m. when she went to wake him up he was difficult to arouse/decreased responsiveness and so he was brought to the ED. In the ED a CT scan of brain was negative for acute intracranial abnormality, and CT of abdomen and pelvis showed mild hydronephrosis with 2 calculi in left renal pelvis up to 7 mm and no evidence of recurrent or metastatic cancer within the abdomen and pelvis,UA in EDP was c/w UTI. In the EDP he was found to be febrile temperature 100.6, and he was noted to be retaining urine, and attempts to place a Foley catheter was unsuccessful and urology was consulted>> cysto done and foley placed and [facility] consulted for admission.  Clinical Impression  Pt admitted with the above. Pt currently presenting with functional limitations due to deficits listed below (see PT Problem List). Pt would benefit from skilled PT to improve independence and safety during mobility and to allow d/c to venue listed below. Pt able to perform bed mobility and transfer with min assist due to LE weakness and to ensure safety. PT discussed SNF recommendation to increase LE strengthening, endurance, and safety with pt and pt's daughter, they seem agreeable with recommendation.  Pt's daughter reports current ALF [facility]) unable to provide the extra assist that pt requires at this time.    PT Assessment  Patient needs continued PT services     Follow Up Recommendations  SNF;Supervision/Assistance - 24 hour    Does the patient have the potential to tolerate intense rehabilitation      Barriers to Discharge        Equipment Recommendations  None recommended by PT    Recommendations for Other Services OT consult (per pt's daughter, pt requires assist during ADLs)   Frequency Min 3X/week    Precautions / Restrictions Precautions Precautions: Fall Restrictions Weight Bearing Restrictions: No   Pertinent Vitals/Pain No c/o of pain/SOB during session.      Mobility  Bed Mobility Bed Mobility: Supine to Sit;Sitting - Scoot to Edge of Bed Supine to Sit: 4: Min assist;HOB elevated Sitting - Scoot to [employer] of Bed: 4: Min guard Details for Bed Mobility Assistance: assist required to guide trunk into sitting due to weakness. VC's for hand placement. Transfers Transfers: Sit to Stand;Stand to [employer] Transfers Sit to Stand: 4: Min assist;With upper extremity assist;From bed Stand to Sit: 4: Min assist;With upper extremity assist;With armrests;To chair/3-in-1 Stand Pivot Transfers: 4: Min assist Details for Transfer Assistance: assist to rise and steady pt due to weakness and pt reporting that he feels "wobbly". sit<>stand from bed performed x2 for technique and safety, VC's for hand placement and transfer sequence with RW. Ambulation/Gait Ambulation/Gait Assistance: Not tested (comment) Ambulation/Gait Assistance Details: due to weakness and unsteadiness during standing.    Exercises     PT Diagnosis: Difficulty walking;Generalized weakness  PT Problem List: Decreased strength;Decreased range of motion;Decreased activity tolerance;Decreased balance;Decreased mobility;Decreased knowledge of use of DME;Decreased safety awareness PT Treatment Interventions:  DME instruction;Gait training;Functional mobility training;Therapeutic activities;Therapeutic exercise;Balance training;Neuromuscular re-education;Patient/family  education     PT Goals(Current goals can be found in the care plan section) Acute Rehab PT Goals Patient Stated Goal: to get stronger PT Goal Formulation: With patient/family Time For Goal Achievement: 01/20/13 Potential to Achieve Goals: Good  Visit Information  Last PT Received On: 01/06/13 Assistance Needed: +1 History of Present Illness: Pt is a 79 y.o. male resident of a [facility] memory care unit with history of dementia, prostate cancer or renal insufficiency, hypertension and CAD who presents with above complaints. The history is obtained from her daughter at the bedside. It is reported that patient had fever 101 last PM and also was complaining of lower abdominal pain. This a.m. when she went to wake him up he was difficult to arouse/decreased responsiveness and so he was brought to the ED. In the ED a CT scan of brain was negative for acute intracranial abnormality, and CT of abdomen and pelvis showed mild hydronephrosis with 2 calculi in left renal pelvis up to 7 mm and no evidence of recurrent or metastatic cancer within the abdomen and pelvis,UA in EDP was c/w UTI. In the EDP he was found to be febrile temperature 100.6, and he was noted to be retaining urine, and attempts to place a Foley catheter was unsuccessful and urology was consulted>> cysto done and foley placed and [facility] consulted for admission.       Prior Functioning  Home Living Family/patient expects to be discharged to:: Assisted living [facility] memory care) Home Equipment: Dan Humphreys - 2 wheels;Shower seat;Wheelchair - manual;Grab bars - toilet;Grab bars - tub/shower Prior Function Level of Independence: Needs assistance Comments: pt's daughter reports pt required assistance for bathing and occassionally required assist during amb. with RW at ALF and utilized manual w/c for community outings. she also reports pt's cognition better in the am than pm and requires more assist when experiencing  "sundowners". Communication Communication: No difficulties    Cognition  Cognition Arousal/Alertness: Awake/alert Behavior During Therapy: WFL for tasks assessed/performed Overall Cognitive Status: History of cognitive impairments - at baseline    Extremity/Trunk Assessment Lower Extremity Assessment Lower Extremity Assessment: Generalized weakness   Balance Balance Balance Assessed: Yes Static Standing Balance Static Standing - Balance Support: Bilateral upper extremity supported;During functional activity Static Standing - Level of Assistance: 4: Min assist Static Standing - Comment/# of Minutes: pt required min assist to steady during standing after sit to stand transfer due to weakness for approx. 30 seconds.  End of Session PT - End of Session Equipment Utilized During Treatment: Gait belt Activity Tolerance: Patient limited by fatigue Patient left: in chair;with nursing/sitter in room;with family/visitor present;with call bell/phone within reach Nurse Communication: Mobility status  GP     Ruben Morrison 01/06/2013, 10:38 AM

## 2013-01-06 NOTE — Progress Notes (Signed)
CSW following for physical therapy evaluation to see if patient is able to return to [facility] or will need snf placement.  Ruben Morrison [name] [name] MSW, LCSW [phone]

## 2013-01-06 NOTE — Evaluation (Signed)
I have reviewed this note and agree with all findings. Kati Leighann Amadon, PT, DPT Pager: [phone]   

## 2013-01-06 NOTE — Progress Notes (Addendum)
CSW met with patient's daughter at bedside. She is patient's HCPOA. Discussed disposition. She feels that it is unlikely that [facility] will be able to handle patient's catheter and care upon discharge. She would like patient faxed out to snf's to see where patient could possibly go. CSW to do same.  Tais Koestner C. Areil Ottey MSW, LCSW [phone]

## 2013-01-06 NOTE — Progress Notes (Signed)
[facility] PROGRESS NOTE  Ruben Morrison ZOX:[id number] DOB: 1925-11-26 DOA: 01/03/2013 PCP: No primary provider on file.  Assessment/Plan: Urethral stricture, prostatic stricture  - Per urology discharge patient: Trimethoprim 100 mg daily   - pt is now status post Cystoscopy with urethral dilation and catheter placement, post op day #2 ; Foley catheter to remain on discharge   Urinary tract infection  - continue Rocephin until discharge and then switched to trimethoprim 100 mg daily per urology    Acute urinary retention  - Foley catheter now in place and will remain until patient sees urologist as outpatient    Leukocytosis  -Resolved  Acute renal failure  - Resolved creatinine is now within normal limits   Dementia  - will be discharged to memory unit center once medically ready and cleared by urology team   Hypertension  - on soft side, monitor vitals per floor protocol   Prostate cancer  - no evidence of recurrent cancer per CT scan    Code Status: Full Family Communication:  Disposition Plan: Discharge in a.m. if stable   Consultants:  Urology  Procedures: Urine cultures; no growth (final)  Antibiotics:  Ceftriaxone 11/2>>>    HPI/Subjective: 79 y.o.WM PMHx  Dementia, prostate cancer and renal insufficiency, hypertension and CAD  (male resident of a [facility]). Presented to [facility] ED with main concern of progressively worsening lower quadrants abdominal pain. History on admission was provided by daughter as pt unable to provide history due to dementia. Daughter explained that pt appeared to have discomfort for several days and the am prior to admission he was more lethargic and difficult to arouse. He was brought to ED and CT scan of the abdomen and pelvis showed mild hydronephrosis with 2 calculi in left renal pelvis up to 7 mm and no evidence of recurrent or metastatic cancer within the abdomen and pelvis, UA checked in ED and was c/w UTI,  pt also found to be febrile with Tmax 101 F. TODAY states negative abdominal pain, negative CP, negative SOB, only request is to have his Foley catheter removed   Objective: Filed Vitals:   01/06/13 0700 01/06/13 1300 01/06/13 1510 01/06/13 2136  BP: 123/60 93/60  118/64  Pulse: 70 72  71  Temp: 98.5 F (36.9 C) 98.3 F (36.8 C)  98.6 F (37 C)  TempSrc: Oral Oral  Oral  Resp: 17 18  18   Height:      Weight:      SpO2: 98% 96% 97% 98%    Intake/Output Summary (Last 24 hours) at 01/06/13 2214 Last data filed at 01/06/13 2137  Gross per 24 hour  Intake    360 ml  Output   1600 ml  Net  -1240 ml   Filed Weights   01/03/13 1728  Weight: 78.9 kg (173 lb 15.1 oz)    Exam:   General: A./O. x1, NAD  Cardiovascular: Regular rate, negative murmurs rubs gallops, DP/PT pulse one plus bilateral  Respiratory: Clear to auscultation bilateral  Abdomen: Soft, nontender, nondistended plus bowel sounds  Musculoskeletal: Rt CVA tenderness, negative pedal edema bilateral   Data Reviewed: Basic Metabolic Panel:  Recent Labs Lab 01/03/13 0929 01/04/13 0458 01/05/13 0515 01/06/13 0525  NA 139 137 141 140  K 3.7 3.6 3.6 3.7  CL 106 107 111 108  CO2 24 22 21 24   GLUCOSE 122* 88 93 84  BUN 21 18 16 17   CREATININE 1.41* 1.15 1.18 1.32  CALCIUM 8.7 8.0* 8.4  8.9   Liver Function Tests:  Recent Labs Lab 01/03/13 0929  AST 20  ALT 15  ALKPHOS 104  BILITOT 0.8  PROT 6.0  ALBUMIN 3.1*    Recent Labs Lab 01/03/13 0929  LIPASE 52   No results found for this basename: AMMONIA,  in the last 168 hours CBC:  Recent Labs Lab 01/03/13 0929 01/04/13 0458 01/05/13 0515 01/06/13 0525  WBC 10.8* 9.4 8.7 8.6  NEUTROABS 7.9*  --   --   --   HGB 13.3 11.9* 11.7* 12.5*  HCT 38.7* 35.1* 34.7* 36.9*  MCV 98.5 99.2 98.0 97.6  PLT 194 168 170 187   Cardiac Enzymes: No results found for this basename: CKTOTAL, CKMB, CKMBINDEX, TROPONINI,  in the last 168 hours BNP (last 3  results) No results found for this basename: PROBNP,  in the last 8760 hours CBG: No results found for this basename: GLUCAP,  in the last 168 hours  Recent Results (from the past 240 hour(s))  URINE CULTURE     Status: None   Collection Time    01/03/13  2:00 PM      Result Value Range Status   Specimen Description URINE, CATHETERIZED   Final   Special Requests NONE   Final   Culture  Setup Time     Final   Value: 01/03/2013 21:22     Performed at [facility] Count     Final   Value: NO GROWTH     Performed at [facility]   Culture     Final   Value: NO GROWTH     Performed at [facility]   Report Status 01/04/2013 FINAL   Final  MRSA PCR SCREENING     Status: None   Collection Time    01/04/13  2:02 AM      Result Value Range Status   MRSA by PCR NEGATIVE  NEGATIVE Final   Comment:            The GeneXpert MRSA Assay (FDA     approved for NASAL specimens     only), is one component of a     comprehensive MRSA colonization     surveillance program. It is not     intended to diagnose MRSA     infection nor to guide or     monitor treatment for     MRSA infections.  URINE CULTURE     Status: None   Collection Time    01/04/13  3:50 PM      Result Value Range Status   Specimen Description URINE, CATHETERIZED   Final   Special Requests NONE   Final   Culture  Setup Time     Final   Value: 01/04/2013 21:43     Performed at [facility] Count     Final   Value: NO GROWTH     Performed at [facility]   Culture     Final   Value: NO GROWTH     Performed at [facility]   Report Status 01/05/2013 FINAL   Final     Studies: No results found.  Scheduled Meds: . albuterol  2 puff Inhalation TID  . aspirin  81 mg Oral Daily  . atorvastatin  20 mg Oral QHS  . calcium carbonate  1 tablet Oral BID  . cefTRIAXone (ROCEPHIN)  IV  1 g Intravenous Q24H  . citalopram  15 mg Oral Daily  . enoxaparin  (LOVENOX) injection  40 mg Subcutaneous Q24H  . mirtazapine  15 mg Oral QHS  . sodium chloride  3 mL Intravenous Q12H  . tamsulosin  0.4 mg Oral Daily  . tiotropium  18 mcg Inhalation Daily   Continuous Infusions:   Principal Problem:   Urethral stricture unspecified Active Problems:   Acute urinary retention   Urinary tract infection, site not specified   Hypertension   Dementia   Prostate cancer   Ureteral stone    Time spent: 35 minutes    WOODS, CURTIS, J  [facility] Pager [phone]. If 7PM-7AM, please contact night-coverage at [email or web address], password [facility] 01/06/2013, 10:14 PM  LOS: 3 days

## 2013-01-07 DIAGNOSIS — N201 Calculus of ureter: Secondary | ICD-10-CM

## 2013-01-07 DIAGNOSIS — R5381 Other malaise: Secondary | ICD-10-CM

## 2013-01-07 MED ORDER — TRIMETHOPRIM 100 MG PO TABS: 100.0000 mg | ORAL_TABLET | Freq: Every day | ORAL | Status: DC

## 2013-01-07 MED ORDER — OXYCODONE HCL 5 MG PO TABS: 5.0000 mg | ORAL_TABLET | Freq: Four times a day (QID) | ORAL | Status: DC | PRN

## 2013-01-07 MED ORDER — TRIMETHOPRIM 100 MG PO TABS
100.0000 mg | ORAL_TABLET | Freq: Every day | ORAL | Status: DC
  Administered 2013-01-07 – 2013-01-08 (×2): 100 mg via ORAL
  Filled 2013-01-07 (×2): qty 1

## 2013-01-07 NOTE — Progress Notes (Signed)
Per [place], CSW, pt will not be transferred to SNF until tomorrow. Pt pulled out IV this AM, the only IV medication pt currently receiving is Rocephin once daily.  Dr Joseph Art made aware of above and order to change IV Rocephin to PO Trimethoprim and leave IV out.

## 2013-01-07 NOTE — Discharge Summary (Signed)
Physician Discharge Summary  Ruben Morrison RUE:[id number] DOB: 05-02-1925 DOA: 01/03/2013  PCP: No primary provider on file.  Admit date: 01/03/2013 Discharge date: 01/07/2013  Time spent:40 minutes  Recommendations for Outpatient Follow-up:  Urethral stricture, prostatic stricture  - Per urology discharge patient: Trimethoprim 100 mg daily  - pt is now status post Cystoscopy with urethral dilation and catheter placement, post op day #2 ; Foley catheter to remain on discharge   Urinary tract infection  - continue Rocephin until discharge and then switched to Trimethoprim 100 mg daily per urology   Acute urinary retention  - Foley catheter now in place and will remain until patient sees urologist as outpatient   Leukocytosis  -Resolved   Acute renal failure  - Resolved creatinine is now within normal limits   Dementia  - will be discharged to memory unit center once medically ready and cleared by urology team   Hypertension  - on soft side, monitor vitals per floor protocol   Prostate cancer  - no evidence of recurrent cancer per CT scan    Discharge Diagnoses:  Principal Problem:   Urethral stricture unspecified Active Problems:   Acute urinary retention   Urinary tract infection, site not specified   Hypertension   Dementia   Prostate cancer   Ureteral stone   Discharge Condition: stabile  Diet recommendation: regular  Filed Weights   01/03/13 1728  Weight: 78.9 kg (173 lb 15.1 oz)    History of present illness:  79 y.o.WM PMHx Dementia, prostate cancer and renal insufficiency, hypertension and CAD (male resident of a [facility] care unit). Presented to [facility] ED with main concern of progressively worsening lower quadrants abdominal pain. History on admission was provided by daughter as pt unable to provide history due to dementia. Daughter explained that pt appeared to have discomfort for several days and the am prior to admission he was more lethargic and  difficult to arouse. He was brought to ED and CT scan of the abdomen and pelvis showed mild hydronephrosis with 2 calculi in left renal pelvis up to 7 mm and no evidence of recurrent or metastatic cancer within the abdomen and pelvis, UA checked in ED and was c/w UTI, pt also found to be febrile with Tmax 101 F. 01/06/2013 states negative abdominal pain, negative CP, negative SOB, only request is to have his Foley catheter removed. TODAY no complaints, requesting now when he will be discharged.    Consultants:  Urology Procedures:  Urine cultures; no growth (final)   Antibiotics:  Ceftriaxone 11/2>>>Stop date 11/6    Discharge Exam: Filed Vitals:   01/07/13 0626 01/07/13 0903 01/07/13 1100 01/07/13 1359  BP: 110/54  102/50 107/52  Pulse: 63  71 67  Temp: 97.5 F (36.4 C)  97.9 F (36.6 C) 98 F (36.7 C)  TempSrc: Oral  Oral Oral  Resp: 18  16 20   Height:      Weight:      SpO2: 94% 95% 95% 97%   General: A./O. x1, NAD  Cardiovascular: Regular rate, negative murmurs rubs gallops, DP/PT pulse one plus bilateral  Respiratory: Clear to auscultation bilateral  Abdomen: Soft, nontender, nondistended plus bowel sounds   Discharge Instructions     Medication List    ASK your doctor about these medications       albuterol 108 (90 BASE) MCG/ACT inhaler  Commonly known as:  PROVENTIL HFA;VENTOLIN HFA  Inhale 2 puffs into the lungs 3 (three) times daily. breathing  aspirin 81 MG chewable tablet  Chew 81 mg by mouth daily.     atorvastatin 20 MG tablet  Commonly known as:  LIPITOR  Take 20 mg by mouth at bedtime.     calcium carbonate 500 MG chewable tablet  Commonly known as:  TUMS - dosed in mg elemental calcium  Chew 1 tablet by mouth 2 (two) times daily.     CEROVITE SENIOR PO  Take 1 capsule by mouth 2 (two) times daily.     citalopram 10 MG tablet  Commonly known as:  CELEXA  Take 15 mg by mouth daily. Takes 1 and 1/2 tablet     mirtazapine 15 MG tablet   Commonly known as:  REMERON  Take 15 mg by mouth at bedtime.     polyethylene glycol packet  Commonly known as:  MIRALAX / GLYCOLAX  Take 17 g by mouth daily as needed (constipation). Constipation     tamsulosin 0.4 MG Caps capsule  Commonly known as:  FLOMAX  Take 0.4 mg by mouth daily.     tiotropium 18 MCG inhalation capsule  Commonly known as:  SPIRIVA  Place 18 mcg into inhaler and inhale daily.       Allergies  Allergen Reactions  . Avelox [Moxifloxacin]     Reaction:Unknown Per medication MAR  . Ciprofloxacin     Unknown reaction- per MAR  . Floxin [Ofloxacin]     Unknown reaction- per [facility]      The results of significant diagnostics from this hospitalization (including imaging, microbiology, ancillary and laboratory) are listed below for reference.    Significant Diagnostic Studies: Ct Abdomen Pelvis Wo Contrast  01/03/2013   CLINICAL DATA:  Abdominal pain. Fever. Urosepsis. Prostate carcinoma.  EXAM: CT ABDOMEN AND PELVIS WITHOUT CONTRAST  TECHNIQUE: Multidetector CT imaging of the abdomen and pelvis was performed following the standard protocol without intravenous contrast.  COMPARISON:  None.  FINDINGS: Chronic bibasilar pulmonary interstitial fibrosis noted. Small loculated pericardial effusion seen along the base of the heart. Small calcified gallstones are seen however there is no evidence of cholecystitis or biliary dilatation. Noncontrast images of the liver, spleen, pancreas, and adrenal glands are normal in appearance.  Small bilateral intrarenal calculi are seen. Mild left hydronephrosis noted, with 2 calculi seen in the left renal pelvis, largest measuring 7 mm. No evidence of ureteral calculi or dilatation. Foley catheter is seen within the bladder which is decompressed. Brachytherapy seeds seen throughout the prostate bed.  No pelvic or abdominal lymphadenopathy identified. No other soft tissue masses identified within the abdomen or pelvis.  Diverticulosis is  seen mainly involving the sigmoid colon, however there is no evidence of diverticulitis. No other inflammatory process or abnormal fluid collections are identified. No evidence of hernia or dilated bowel loops. No suspicious bone lesions identified.  IMPRESSION: No evidence of recurrent or metastatic carcinoma within the abdomen or pelvis.  Mild hydronephrosis, with 2 calculi in left renal pelvis measuring up to 7 mm. Nonobstructive bilateral nephrolithiasis also noted.  Cholelithiasis.  No radiographic evidence of cholecystitis.  Diverticulosis. No radiographic evidence of diverticulitis.   Electronically Signed   By: Myles Rosenthal M.D.   On: 01/03/2013 16:22   Dg Chest 2 View  01/03/2013   CLINICAL DATA:  Fever. Fatigue.  EXAM: CHEST  2 VIEW  COMPARISON:  None.  FINDINGS: The heart size is within normal limits. Previous CABG noted. Low lung volumes are seen. Diffuse interstitial prominence is likely chronic. Mild linear opacity in the  right upper lobe and left lung base is consistent with scarring or atelectasis. No evidence of pulmonary consolidation or pleural effusion.  IMPRESSION: Low lung volumes and probable chronic interstitial lung disease. Mild right upper and left lower lobe scarring versus atelectasis.   Electronically Signed   By: Myles Rosenthal M.D.   On: 01/03/2013 09:16   Ct Head Wo Contrast  01/03/2013   CLINICAL DATA:  Initial encounter for patient who was unresponsive earlier and has history of dementia.  EXAM: CT HEAD WITHOUT CONTRAST  TECHNIQUE: Contiguous axial images were obtained from the base of the skull through the vertex without intravenous contrast.  COMPARISON:  Multiple prior examinations dating back to 02/20/2012, most recently 12/16/2012.  FINDINGS: Severe cortical and deep atrophy and moderate cerebellar atrophy, unchanged. Moderate changes of small vessel disease of the white matter diffusely, unchanged. No mass lesion. No midline shift. No acute hemorrhage or hematoma. No  extra-axial fluid collections. No evidence of acute infarction.  No focal osseous abnormality involving the skull. Visualized paranasal sinuses, bilateral mastoid air cells, and bilateral middle ear cavities well-aerated. Extensive bilateral carotid siphon atherosclerosis.  IMPRESSION: 1. No acute intracranial abnormality. 2. Stable severe generalized atrophy and moderate chronic microvascular ischemic changes of the white matter. 3. Extensive bilateral carotid siphon atherosclerosis.   Electronically Signed   By: Hulan Saas M.D.   On: 01/03/2013 16:18   Ct Head Wo Contrast  12/16/2012   CLINICAL DATA:  Fall with head injury.  EXAM: CT HEAD WITHOUT CONTRAST  TECHNIQUE: Contiguous axial images were obtained from the base of the skull through the vertex without intravenous contrast.  COMPARISON:  Head CT 03/29/2012.  FINDINGS: There is no evidence of acute intracranial hemorrhage, mass lesion, brain edema or extra-axial fluid collection. The ventricles and subarachnoid spaces remain diffusely enlarged consistent with atrophy. There is no CT evidence of acute cortical infarction.  The visualized paranasal sinuses, mastoid air cells and middle ears are clear. The calvarium is intact.  IMPRESSION: Stable atrophy.  No acute intracranial or calvarial findings.   Electronically Signed   By: Roxy Horseman M.D.   On: 12/16/2012 10:30    Microbiology: Recent Results (from the past 240 hour(s))  URINE CULTURE     Status: None   Collection Time    01/03/13  2:00 PM      Result Value Range Status   Specimen Description URINE, CATHETERIZED   Final   Special Requests NONE   Final   Culture  Setup Time     Final   Value: 01/03/2013 21:22     Performed at [facility] Count     Final   Value: NO GROWTH     Performed at [facility]   Culture     Final   Value: NO GROWTH     Performed at [facility]   Report Status 01/04/2013 FINAL   Final  MRSA PCR SCREENING     Status:  None   Collection Time    01/04/13  2:02 AM      Result Value Range Status   MRSA by PCR NEGATIVE  NEGATIVE Final   Comment:            The GeneXpert MRSA Assay (FDA     approved for NASAL specimens     only), is one component of a     comprehensive MRSA colonization     surveillance program. It is not  intended to diagnose MRSA     infection nor to guide or     monitor treatment for     MRSA infections.  URINE CULTURE     Status: None   Collection Time    01/04/13  3:50 PM      Result Value Range Status   Specimen Description URINE, CATHETERIZED   Final   Special Requests NONE   Final   Culture  Setup Time     Final   Value: 01/04/2013 21:43     Performed at [facility] Count     Final   Value: NO GROWTH     Performed at [facility]   Culture     Final   Value: NO GROWTH     Performed at [facility]   Report Status 01/05/2013 FINAL   Final     Labs: Basic Metabolic Panel:  Recent Labs Lab 01/03/13 0929 01/04/13 0458 01/05/13 0515 01/06/13 0525  NA 139 137 141 140  K 3.7 3.6 3.6 3.7  CL 106 107 111 108  CO2 24 22 21 24   GLUCOSE 122* 88 93 84  BUN 21 18 16 17   CREATININE 1.41* 1.15 1.18 1.32  CALCIUM 8.7 8.0* 8.4 8.9   Liver Function Tests:  Recent Labs Lab 01/03/13 0929  AST 20  ALT 15  ALKPHOS 104  BILITOT 0.8  PROT 6.0  ALBUMIN 3.1*    Recent Labs Lab 01/03/13 0929  LIPASE 52   No results found for this basename: AMMONIA,  in the last 168 hours CBC:  Recent Labs Lab 01/03/13 0929 01/04/13 0458 01/05/13 0515 01/06/13 0525  WBC 10.8* 9.4 8.7 8.6  NEUTROABS 7.9*  --   --   --   HGB 13.3 11.9* 11.7* 12.5*  HCT 38.7* 35.1* 34.7* 36.9*  MCV 98.5 99.2 98.0 97.6  PLT 194 168 170 187   Cardiac Enzymes: No results found for this basename: CKTOTAL, CKMB, CKMBINDEX, TROPONINI,  in the last 168 hours BNP: BNP (last 3 results) No results found for this basename: PROBNP,  in the last 8760  hours CBG: No results found for this basename: GLUCAP,  in the last 168 hours     Signed:  Carolyne Littles, MD [facility] [phone] pager

## 2013-01-07 NOTE — Progress Notes (Addendum)
CSW spoke on the phone with patient's daughter Shawna [place], gave bed offers. She will look into them and then call CSW back.  Artia Singley C. Cherine Drumgoole MSW, LCSW [phone] [facility]. Francine Graven auth submitted.  Magdiel Bartles C. Kimari Coudriet MSW, LCSW [phone]

## 2013-01-08 ENCOUNTER — Other Ambulatory Visit: Payer: Self-pay | Admitting: *Deleted

## 2013-01-08 NOTE — Progress Notes (Signed)
Physical Therapy Treatment Patient Details Name: Ruben Morrison MRN: [id number] DOB: 19-Nov-1925 Today's Date: 01/08/2013 Time: 1914-7829 PT Time Calculation (min): 25 min  PT Assessment / Plan / Recommendation  History of Present Illness Pt is a 79 y.o. male resident of a [facility] memory care unit with history of dementia, prostate cancer or renal insufficiency, hypertension and CAD who presents with above complaints. The history is obtained from her daughter at the bedside. It is reported that patient had fever 101 last PM and also was complaining of lower abdominal pain. This a.m. when she went to wake him up he was difficult to arouse/decreased responsiveness and so he was brought to the ED. In the ED a CT scan of brain was negative for acute intracranial abnormality, and CT of abdomen and pelvis showed mild hydronephrosis with 2 calculi in left renal pelvis up to 7 mm and no evidence of recurrent or metastatic cancer within the abdomen and pelvis,UA in EDP was c/w UTI. In the EDP he was found to be febrile temperature 100.6, and he was noted to be retaining urine, and attempts to place a Foley catheter was unsuccessful and urology was consulted>> cysto done and foley placed and [facility] consulted for admission.   PT Comments   Pt more alert and conversive.  "Is this Monday?".  Oriented to date and location.  Pt repeated "This is [place]?".  Pt followed all commands as we assisted him OOB to [facility] then to amb in hallway.  Pt required extra assist to maneuver walker safely around obstacles and thru doorway as well as turns.  Tolerated session well.   Follow Up Recommendations  SNF [facility])     Does the patient have the potential to tolerate intense rehabilitation     Barriers to Discharge        Equipment Recommendations       Recommendations for Other Services    Frequency Min 3X/week   Progress towards PT Goals Progress towards PT goals: Progressing toward goals  Plan       Precautions / Restrictions Precautions Precautions: Fall Precaution Comments: dementia Restrictions Weight Bearing Restrictions: No    Pertinent Vitals/Pain No c/o pain    Mobility  Bed Mobility Bed Mobility: Supine to Sit;Sitting - Scoot to Edge of Bed Supine to Sit: 4: Min assist;HOB elevated Sitting - Scoot to [place] of Bed: 4: Min guard Details for Bed Mobility Assistance: repeat cueing and assist with B LE plus increased time. Transfers Transfers: Sit to Stand;Stand to [employer] Transfers Sit to Stand: 4: Min assist;With upper extremity assist;From bed;From toilet Stand to Sit: 4: Min assist;With upper extremity assist;With armrests;To chair/3-in-1;To toilet Stand Pivot Transfers: 4: Min assist Details for Transfer Assistance: Assisted OOB to [facility] then to standing for hygiene and gait.  75% VC's on proper hand placement and turn completion prior to sit. Ambulation/Gait Ambulation/Gait Assistance: 4: Min assist;3: Mod assist Ambulation Distance (Feet): 180 Feet (09 feet x 2 one sitting rest break) Assistive device: Rolling walker Ambulation/Gait Assistance Details: 75% VC's on direction and negociating thru doorway and around obsticles.   Gait Pattern: Step-to pattern;Step-through pattern;Shuffle;Trunk flexed Gait velocity: decreased     PT Goals (current goals can now be found in the care plan section)    Visit Information  Last PT Received On: 01/08/13 Assistance Needed: +1 History of Present Illness: Pt is a 79 y.o. male resident of a [facility] memory care unit with history of dementia, prostate cancer or renal insufficiency, hypertension and CAD  who presents with above complaints. The history is obtained from her daughter at the bedside. It is reported that patient had fever 101 last PM and also was complaining of lower abdominal pain. This a.m. when she went to wake him up he was difficult to arouse/decreased responsiveness and so he was brought to the ED. In the ED a  CT scan of brain was negative for acute intracranial abnormality, and CT of abdomen and pelvis showed mild hydronephrosis with 2 calculi in left renal pelvis up to 7 mm and no evidence of recurrent or metastatic cancer within the abdomen and pelvis,UA in EDP was c/w UTI. In the EDP he was found to be febrile temperature 100.6, and he was noted to be retaining urine, and attempts to place a Foley catheter was unsuccessful and urology was consulted>> cysto done and foley placed and TRH consulted for admission.    Subjective Data      Cognition       Balance     End of Session PT - End of Session Equipment Utilized During Treatment: Gait belt Patient left: in chair;with call bell/phone within reach;with chair alarm set   Felecia Shelling  PTA [facility]  Acute  Rehab Pager      [phone]

## 2013-01-08 NOTE — Progress Notes (Signed)
Clinical Social Work Department CLINICAL SOCIAL WORK PLACEMENT NOTE 01/08/2013  Patient:  Ruben Morrison, Ruben Morrison  Account Number:  [id number] Admit date:  01/03/2013  Clinical Social Worker:  Becky Sax, LCSW  Date/time:  01/08/2013 12:00 M  Clinical Social Work is seeking post-discharge placement for this patient at the following level of care:   SKILLED NURSING   (*CSW will update this form in Epic as items are completed)   01/08/2013  Patient/family provided with [facility] Department of Clinical Social Work's list of facilities offering this level of care within the geographic area requested by the patient (or if unable, by the patient's family).  01/08/2013  Patient/family informed of their freedom to choose among providers that offer the needed level of care, that participate in Medicare, Medicaid or managed care program needed by the patient, have an available bed and are willing to accept the patient.  01/08/2013  Patient/family informed of [facility]' ownership interest in [facility], as well as of the fact that they are under no obligation to receive care at this facility.  PASARR submitted to EDS on 01/08/2013 PASARR number received from EDS on 01/08/2013  FL2 transmitted to all facilities in geographic area requested by pt/family on  01/08/2013 FL2 transmitted to all facilities within larger geographic area on 01/08/2013  Patient informed that his/her managed care company has contracts with or will negotiate with  certain facilities, including the following:     Patient/family informed of bed offers received:  01/08/2013 Patient chooses bed at [facility] Physician recommends and patient chooses bed at    Patient to be transferred to [facility] on  01/08/2013 Patient to be transferred to facility by ptar  The following physician request were entered in Epic:   Additional Comments:

## 2013-01-08 NOTE — Progress Notes (Signed)
CSW assisting with d/c planning. RNCM from [facility] insurance contacted this am requesting authorization for SNF placement at [facility] for d/c today. Message was left. Expecting return call this morning.   Overton Boggus LCSW [phone]

## 2013-01-08 NOTE — Progress Notes (Signed)
Authorization received. Packet copied and placed in [place].  Porshea Janowski C. Billye Nydam MSW, LCSW [phone]

## 2013-01-08 NOTE — Progress Notes (Signed)
Patient seen and examined at bedside, please see discharge summary dated 01/07/2013. No changes to discharge summary needed. Patient is clinically stable for discharge.  Debbora Presto, MD  [facility] Pager [phone]  If 7PM-7AM, please contact night-coverage [email or web address] Password [email or web address]

## 2013-01-11 ENCOUNTER — Other Ambulatory Visit: Payer: Self-pay | Admitting: *Deleted

## 2013-01-11 MED ORDER — OXYCODONE HCL 5 MG PO TABS: 5.0000 mg | ORAL_TABLET | Freq: Four times a day (QID) | ORAL | Status: DC | PRN

## 2013-01-12 ENCOUNTER — Encounter (HOSPITAL_COMMUNITY): Payer: Self-pay | Admitting: *Deleted

## 2013-01-12 NOTE — Progress Notes (Signed)
Spoke with Ruben Morrison, patient's daughter and health care POA. Patient is residing at [facility] facility [phone]). Went over instructions with daughter. Called [facility] and found out patient's last aspirin was 01/11/13 at 10 AM. This is less than the 72 hours as required by [employer]. CT states stone to be treated is within the kidney. Dr Annabell Howells needs to be notified about this so that daughter does not bring patient in for procedure if it cannot be done. Short Stay to notify Dr Annabell Howells on 01/13/13 during his business orders. Ruben Morrison, daughter, is called back and informed of above.

## 2013-01-13 ENCOUNTER — Other Ambulatory Visit: Payer: Self-pay | Admitting: Urology

## 2013-01-13 MED ORDER — DEXTROSE 5 % IV SOLN: 2.0000 g | Freq: Once | INTRAVENOUS | Status: DC

## 2013-01-13 NOTE — Progress Notes (Signed)
Spoke with Museum/gallery curator at [facility] nursing facility. Ruben Morrison's last dose of Aspirin was 01/11/13 at 10:00am.  That would be over 72 hours since his aspirin dosage and his scheduled ESWL because his procedure is 1345 on 01/14/13. Reinforced to [name] that Aspirin products should be omitted until after his procedure

## 2013-01-14 ENCOUNTER — Ambulatory Visit (HOSPITAL_COMMUNITY)
Admission: RE | Admit: 2013-01-14 | Discharge: 2013-01-14 | Disposition: A | Discharge status: Skilled Nursing Facility | Payer: Medicare HMO | Source: Ambulatory Visit | Attending: Urology | Admitting: Urology

## 2013-01-14 ENCOUNTER — Encounter (HOSPITAL_COMMUNITY): Payer: Medicare HMO | Admitting: Certified Registered Nurse Anesthetist

## 2013-01-14 ENCOUNTER — Ambulatory Visit (HOSPITAL_COMMUNITY): Payer: Medicare HMO

## 2013-01-14 ENCOUNTER — Non-Acute Institutional Stay (SKILLED_NURSING_FACILITY): Payer: Medicare HMO | Admitting: Internal Medicine

## 2013-01-14 ENCOUNTER — Encounter (HOSPITAL_COMMUNITY)
Admission: RE | Disposition: A | Discharge status: Skilled Nursing Facility | Payer: Self-pay | Source: Ambulatory Visit | Attending: Urology

## 2013-01-14 ENCOUNTER — Encounter (HOSPITAL_COMMUNITY): Payer: Self-pay | Admitting: *Deleted

## 2013-01-14 ENCOUNTER — Ambulatory Visit (HOSPITAL_COMMUNITY): Payer: Medicare HMO | Admitting: Certified Registered Nurse Anesthetist

## 2013-01-14 DIAGNOSIS — F0390 Unspecified dementia without behavioral disturbance: Secondary | ICD-10-CM

## 2013-01-14 DIAGNOSIS — I10 Essential (primary) hypertension: Secondary | ICD-10-CM | POA: Insufficient documentation

## 2013-01-14 DIAGNOSIS — N35919 Unspecified urethral stricture, male, unspecified site: Secondary | ICD-10-CM

## 2013-01-14 DIAGNOSIS — N201 Calculus of ureter: Secondary | ICD-10-CM | POA: Insufficient documentation

## 2013-01-14 DIAGNOSIS — N200 Calculus of kidney: Secondary | ICD-10-CM | POA: Insufficient documentation

## 2013-01-14 DIAGNOSIS — I2510 Atherosclerotic heart disease of native coronary artery without angina pectoris: Secondary | ICD-10-CM

## 2013-01-14 DIAGNOSIS — C61 Malignant neoplasm of prostate: Secondary | ICD-10-CM | POA: Insufficient documentation

## 2013-01-14 DIAGNOSIS — K8020 Calculus of gallbladder without cholecystitis without obstruction: Secondary | ICD-10-CM | POA: Insufficient documentation

## 2013-01-14 DIAGNOSIS — Z7982 Long term (current) use of aspirin: Secondary | ICD-10-CM | POA: Insufficient documentation

## 2013-01-14 DIAGNOSIS — Z951 Presence of aortocoronary bypass graft: Secondary | ICD-10-CM | POA: Insufficient documentation

## 2013-01-14 SURGERY — LITHOTRIPSY, ESWL
Anesthesia: Monitor Anesthesia Care | Laterality: Left

## 2013-01-14 MED ORDER — DEXTROSE-NACL 5-0.45 % IV SOLN
INTRAVENOUS | Status: DC
  Administered 2013-01-14: 12:00:00 via INTRAVENOUS

## 2013-01-14 MED ORDER — CEFAZOLIN SODIUM-DEXTROSE 2-3 GM-% IV SOLR
2.0000 g | Freq: Once | INTRAVENOUS | Status: AC
  Administered 2013-01-14: 2 g via INTRAVENOUS

## 2013-01-14 MED ORDER — ONDANSETRON HCL 4 MG/2ML IJ SOLN: 4.0000 mg | Freq: Four times a day (QID) | INTRAMUSCULAR | Status: DC | PRN

## 2013-01-14 MED ORDER — SODIUM CHLORIDE 0.9 % IV SOLN: 250.0000 mL | INTRAVENOUS | Status: DC | PRN

## 2013-01-14 MED ORDER — CEFAZOLIN SODIUM-DEXTROSE 2-3 GM-% IV SOLR
INTRAVENOUS | Status: AC
  Filled 2013-01-14: qty 50

## 2013-01-14 MED ORDER — FENTANYL CITRATE 0.05 MG/ML IJ SOLN: 25.0000 ug | INTRAMUSCULAR | Status: DC | PRN

## 2013-01-14 MED ORDER — ACETAMINOPHEN 650 MG RE SUPP
650.0000 mg | RECTAL | Status: DC | PRN
  Filled 2013-01-14: qty 1

## 2013-01-14 MED ORDER — ACETAMINOPHEN 325 MG PO TABS: 650.0000 mg | ORAL_TABLET | ORAL | Status: DC | PRN

## 2013-01-14 MED ORDER — HYDROCODONE-ACETAMINOPHEN 5-325 MG PO TABS: 1.0000 | ORAL_TABLET | Freq: Four times a day (QID) | ORAL | Status: DC | PRN

## 2013-01-14 MED ORDER — SODIUM CHLORIDE 0.9 % IJ SOLN: 3.0000 mL | Freq: Two times a day (BID) | INTRAMUSCULAR | Status: DC

## 2013-01-14 MED ORDER — SODIUM CHLORIDE 0.9 % IJ SOLN: 3.0000 mL | INTRAMUSCULAR | Status: DC | PRN

## 2013-01-14 MED ORDER — DIPHENHYDRAMINE HCL 25 MG PO CAPS
25.0000 mg | ORAL_CAPSULE | ORAL | Status: DC
  Filled 2013-01-14: qty 1

## 2013-01-14 MED ORDER — OXYCODONE HCL 5 MG PO TABS: 5.0000 mg | ORAL_TABLET | ORAL | Status: DC | PRN

## 2013-01-14 MED ORDER — DIAZEPAM 5 MG PO TABS
10.0000 mg | ORAL_TABLET | ORAL | Status: DC
  Filled 2013-01-14: qty 2

## 2013-01-14 MED ORDER — LACTATED RINGERS IV SOLN: INTRAVENOUS | Status: DC

## 2013-01-14 NOTE — H&P (View-Only) (Signed)
Subjective: I was asked to see Ruben Morrison in consultation by Dr. Criss Alvine.  He is an 79 yo WM with a remote history of prostate cancer treated with brachytherapy.  He presents now with signs of sepsis and attempt at foley placement was unsuccessful x 3.   He is demented and was initially unresponsive and unable to provide additional history.  His daughter was available and reports no other GU history or recent voiding complaints but he has been on tamsulosin.   Past Medical History  Diagnosis Date  . Dementia   . Renal disorder   . Pneumococcal pneumonia   . Hypertension   . Encephalopathy acute   . Prostate cancer   . Coronary artery disease    Past Surgical History  Procedure Laterality Date  . Cardiac surgery    . Insertion prostate radiation seed    . Inguinal hernia repair     History   Social History  . Marital Status: Married    Spouse Name: N/A    Number of Children: N/A  . Years of Education: N/A   Occupational History  . Not on file.   Social History Main Topics  . Smoking status: Never Smoker   . Smokeless tobacco: Not on file  . Alcohol Use: No  . Drug Use: No  . Sexual Activity: Not on file   Other Topics Concern  . Not on file   Social History Narrative  . No narrative on file  Pt lives in a memory care facility.    History reviewed. No pertinent family history.  Allergies  Allergen Reactions  . Avelox [Moxifloxacin]     Reaction:Unknown Per medication MAR  . Ciprofloxacin     Unknown reaction- per MAR  . Floxin [Ofloxacin]     Unknown reaction- per MAR    ROS: History obtained from unobtainable from patient due to mental status and I spoke to the daughter who was able to provide limited details of the past medical history.   Objective: Vital signs in last 24 hours: Temp:  [100.3 F (37.9 C)-100.6 F (38.1 C)] 100.6 F (38.1 C) (11/02 0818) Pulse Rate:  [64-95] 64 (11/02 1213) Resp:  [16-22] 20 (11/02 1213) BP:  (105-117)/(61-67) 117/62 mmHg (11/02 1213) SpO2:  [93 %-97 %] 96 % (11/02 1213)  Intake/Output from previous day:   Intake/Output this shift:    General appearance: cooperative and slowed mentation GI: soft and flat.  No mass or tenderness. Male genitalia: Uncircumcised with mild phimosis and an adequate meatus with blood post cath attempts.   scrotum and testes unremarkable.   Lab Results:   Recent Labs  01/03/13 0929  WBC 10.8*  HGB 13.3  HCT 38.7*  PLT 194   BMET  Recent Labs  01/03/13 0929  NA 139  K 3.7  CL 106  CO2 24  GLUCOSE 122*  BUN 21  CREATININE 1.41*  CALCIUM 8.7   PT/INR No results found for this basename: LABPROT, INR,  in the last 72 hours ABG No results found for this basename: PHART, PCO2, PO2, HCO3,  in the last 72 hours  Studies/Results: Dg Chest 2 View  01/03/2013   CLINICAL DATA:  Fever. Fatigue.  EXAM: CHEST  2 VIEW  COMPARISON:  None.  FINDINGS: The heart size is within normal limits. Previous CABG noted. Low lung volumes are seen. Diffuse interstitial prominence is likely chronic. Mild linear opacity in the right upper lobe and left lung base is consistent with  scarring or atelectasis. No evidence of pulmonary consolidation or pleural effusion.  IMPRESSION: Low lung volumes and probable chronic interstitial lung disease. Mild right upper and left lower lobe scarring versus atelectasis.   Electronically Signed   By: Myles Rosenthal M.D.   On: 01/03/2013 09:16    Anti-infectives: Anti-infectives   None      No current facility-administered medications for this encounter.   Current Outpatient Prescriptions  Medication Sig Dispense Refill  . albuterol (PROVENTIL HFA;VENTOLIN HFA) 108 (90 BASE) MCG/ACT inhaler Inhale 2 puffs into the lungs 3 (three) times daily. breathing      . aspirin 81 MG chewable tablet Chew 81 mg by mouth daily.      Marland Kitchen atorvastatin (LIPITOR) 20 MG tablet Take 20 mg by mouth at bedtime.       . calcium carbonate (TUMS -  DOSED IN MG ELEMENTAL CALCIUM) 500 MG chewable tablet Chew 1 tablet by mouth 2 (two) times daily.      . citalopram (CELEXA) 10 MG tablet Take 15 mg by mouth daily. Takes 1 and 1/2 tablet      . mirtazapine (REMERON) 15 MG tablet Take 15 mg by mouth at bedtime.      . Multiple Vitamins-Minerals (CEROVITE SENIOR PO) Take 1 capsule by mouth 2 (two) times daily.      . polyethylene glycol (MIRALAX / GLYCOLAX) packet Take 17 g by mouth daily as needed (constipation). Constipation      . Tamsulosin HCl (FLOMAX) 0.4 MG CAPS Take 0.4 mg by mouth daily.      Marland Kitchen tiotropium (SPIRIVA) 18 MCG inhalation capsule Place 18 mcg into inhaler and inhale daily.        Procedure:   Initial attempt at foley placement with 46fr coude was unsuccessfull.    Cystoscopy was done and he was found to have a dense prostatic stricture.  A wire was successfully passed and he was dilated to 56fr with goodwin sounds over the wire.   A 32fr council foley was placed over the wire.  The balloon was filled with 10cc and he returned clear urine.   The catheter was placed to drainage after a culture was collected.   Assessment: Acute urinary retention with sepsis and a prostatic post radiation urethral stricture.  Plan: Leave foley for now. Check PSA to make sure he doesn't have recurrent cancer as the cause. I will follow.   CC: Dr. Alric Ran   LOS: 0 days    Anner Crete 01/03/2013

## 2013-01-14 NOTE — Interval H&P Note (Signed)
History and Physical Interval Note:  Ruben Morrison returns today for treatment of 2 7mm left proximal stones.  He remains on TMP for suppression and foley drainage.   Consent obtained from the daughter.      01/14/2013 2:21 PM  Ruben Morrison  has presented today for surgery, with the diagnosis of LEFT URETERAL STONE   The various methods of treatment have been discussed with the patient and family. After consideration of risks, benefits and other options for treatment, the patient has consented to  Procedure(s): LEFT EXTRACORPOREAL SHOCK WAVE LITHOTRIPSY (ESWL) (Left) as a surgical intervention .  The patient's history has been reviewed, patient examined, no change in status, stable for surgery.  I have reviewed the patient's chart and labs.  Questions were answered to the patient's satisfaction.     Tayva Easterday,Kilan J

## 2013-01-14 NOTE — Progress Notes (Signed)
Called Dr. Annabell Howells to ask when patient should resume aspirin and if he should continue pre-op suppressive antibiotic. Aspirin to be resumed in 48 hours and continue antibiotic until he sees Dr. Annabell Howells on Friday. Will inform patient.

## 2013-01-14 NOTE — Anesthesia Preprocedure Evaluation (Deleted)
Anesthesia Evaluation  Patient identified by MRN, date of birth, ID band Patient awake    Reviewed: Allergy & Precautions, H&P , NPO status , Patient's Chart, lab work & pertinent test results  Airway Mallampati: II TM Distance: >3 FB Neck ROM: full    Dental no notable dental hx. (+) Dental Advisory Given and Chipped Chipped right upper front:   Pulmonary neg pulmonary ROS,  breath sounds clear to auscultation  Pulmonary exam normal       Cardiovascular Exercise Tolerance: Good hypertension, + CAD and + CABG negative cardio ROS  Rhythm:regular Rate:Normal     Neuro/Psych Acute encephalopathy. dementia negative neurological ROS  negative psych ROS   GI/Hepatic negative GI ROS, Neg liver ROS,   Endo/Other  negative endocrine ROS  Renal/GU negative Renal ROS  negative genitourinary   Musculoskeletal   Abdominal   Peds  Hematology negative hematology ROS (+)   Anesthesia Other Findings   Reproductive/Obstetrics negative OB ROS                           Anesthesia Physical Anesthesia Plan  ASA: III  Anesthesia Plan: MAC   Post-op Pain Management:    Induction:   Airway Management Planned: Simple Face Mask  Additional Equipment:   Intra-op Plan:   Post-operative Plan:   Informed Consent: I have reviewed the patients History and Physical, chart, labs and discussed the procedure including the risks, benefits and alternatives for the proposed anesthesia with the patient or authorized representative who has indicated his/her understanding and acceptance.   Dental Advisory Given  Plan Discussed with: CRNA and Surgeon  Anesthesia Plan Comments:         Anesthesia Quick Evaluation

## 2013-01-15 ENCOUNTER — Encounter (HOSPITAL_COMMUNITY): Payer: Self-pay | Admitting: Pharmacy Technician

## 2013-01-21 ENCOUNTER — Non-Acute Institutional Stay (SKILLED_NURSING_FACILITY): Payer: Medicare HMO | Admitting: Internal Medicine

## 2013-01-21 DIAGNOSIS — N179 Acute kidney failure, unspecified: Secondary | ICD-10-CM

## 2013-01-21 DIAGNOSIS — D7589 Other specified diseases of blood and blood-forming organs: Secondary | ICD-10-CM

## 2013-01-24 DIAGNOSIS — N211 Calculus in urethra: Secondary | ICD-10-CM

## 2013-01-24 DIAGNOSIS — N390 Urinary tract infection, site not specified: Secondary | ICD-10-CM

## 2013-01-24 DIAGNOSIS — R339 Retention of urine, unspecified: Secondary | ICD-10-CM

## 2013-01-24 DIAGNOSIS — R32 Unspecified urinary incontinence: Secondary | ICD-10-CM

## 2013-01-26 ENCOUNTER — Encounter (HOSPITAL_COMMUNITY): Payer: Self-pay | Admitting: Emergency Medicine

## 2013-01-26 ENCOUNTER — Inpatient Hospital Stay (HOSPITAL_COMMUNITY)
Admission: EM | Admit: 2013-01-26 | Discharge: 2013-02-03 | DRG: 690 | Disposition: A | Discharge status: Skilled Nursing Facility | Payer: Medicare HMO | Attending: Internal Medicine | Admitting: Internal Medicine

## 2013-01-26 ENCOUNTER — Inpatient Hospital Stay (HOSPITAL_COMMUNITY): Payer: Medicare HMO

## 2013-01-26 DIAGNOSIS — E785 Hyperlipidemia, unspecified: Secondary | ICD-10-CM

## 2013-01-26 DIAGNOSIS — R310 Gross hematuria: Secondary | ICD-10-CM | POA: Diagnosis present

## 2013-01-26 DIAGNOSIS — R338 Other retention of urine: Secondary | ICD-10-CM

## 2013-01-26 DIAGNOSIS — Z7982 Long term (current) use of aspirin: Secondary | ICD-10-CM

## 2013-01-26 DIAGNOSIS — R339 Retention of urine, unspecified: Secondary | ICD-10-CM | POA: Diagnosis present

## 2013-01-26 DIAGNOSIS — Z87442 Personal history of urinary calculi: Secondary | ICD-10-CM

## 2013-01-26 DIAGNOSIS — N35919 Unspecified urethral stricture, male, unspecified site: Secondary | ICD-10-CM

## 2013-01-26 DIAGNOSIS — J4489 Other specified chronic obstructive pulmonary disease: Secondary | ICD-10-CM | POA: Diagnosis present

## 2013-01-26 DIAGNOSIS — N4289 Other specified disorders of prostate: Secondary | ICD-10-CM | POA: Diagnosis present

## 2013-01-26 DIAGNOSIS — F0390 Unspecified dementia without behavioral disturbance: Secondary | ICD-10-CM

## 2013-01-26 DIAGNOSIS — R5381 Other malaise: Secondary | ICD-10-CM | POA: Diagnosis present

## 2013-01-26 DIAGNOSIS — N179 Acute kidney failure, unspecified: Secondary | ICD-10-CM

## 2013-01-26 DIAGNOSIS — E870 Hyperosmolality and hypernatremia: Secondary | ICD-10-CM | POA: Diagnosis present

## 2013-01-26 DIAGNOSIS — F3289 Other specified depressive episodes: Secondary | ICD-10-CM | POA: Diagnosis present

## 2013-01-26 DIAGNOSIS — R630 Anorexia: Secondary | ICD-10-CM | POA: Diagnosis present

## 2013-01-26 DIAGNOSIS — Z951 Presence of aortocoronary bypass graft: Secondary | ICD-10-CM

## 2013-01-26 DIAGNOSIS — F32A Depression, unspecified: Secondary | ICD-10-CM

## 2013-01-26 DIAGNOSIS — J449 Chronic obstructive pulmonary disease, unspecified: Secondary | ICD-10-CM | POA: Diagnosis present

## 2013-01-26 DIAGNOSIS — Z8546 Personal history of malignant neoplasm of prostate: Secondary | ICD-10-CM

## 2013-01-26 DIAGNOSIS — N390 Urinary tract infection, site not specified: Principal | ICD-10-CM

## 2013-01-26 DIAGNOSIS — N201 Calculus of ureter: Secondary | ICD-10-CM | POA: Diagnosis present

## 2013-01-26 DIAGNOSIS — F329 Major depressive disorder, single episode, unspecified: Secondary | ICD-10-CM | POA: Diagnosis present

## 2013-01-26 DIAGNOSIS — Z923 Personal history of irradiation: Secondary | ICD-10-CM

## 2013-01-26 DIAGNOSIS — R319 Hematuria, unspecified: Secondary | ICD-10-CM

## 2013-01-26 DIAGNOSIS — Z87440 Personal history of urinary (tract) infections: Secondary | ICD-10-CM

## 2013-01-26 DIAGNOSIS — I2510 Atherosclerotic heart disease of native coronary artery without angina pectoris: Secondary | ICD-10-CM | POA: Diagnosis present

## 2013-01-26 DIAGNOSIS — R404 Transient alteration of awareness: Secondary | ICD-10-CM | POA: Diagnosis present

## 2013-01-26 DIAGNOSIS — I10 Essential (primary) hypertension: Secondary | ICD-10-CM

## 2013-01-26 LAB — CBC WITH DIFFERENTIAL/PLATELET
Basophils Absolute: 0 10*3/uL (ref 0.0–0.1)
Basophils Relative: 0 % (ref 0–1)
Eosinophils Relative: 1 % (ref 0–5)
HCT: 44.6 % (ref 39.0–52.0)
Lymphs Abs: 2.2 10*3/uL (ref 0.7–4.0)
MCHC: 34.1 g/dL (ref 30.0–36.0)
MCV: 98.2 fL (ref 78.0–100.0)
Monocytes Relative: 9 % (ref 3–12)
Neutro Abs: 11.1 10*3/uL — ABNORMAL HIGH (ref 1.7–7.7)
RBC: 4.54 MIL/uL (ref 4.22–5.81)
RDW: 14 % (ref 11.5–15.5)
WBC: 14.7 10*3/uL — ABNORMAL HIGH (ref 4.0–10.5)

## 2013-01-26 LAB — URINALYSIS, ROUTINE W REFLEX MICROSCOPIC
Glucose, UA: 250 mg/dL — AB
Ketones, ur: >80 mg/dL — AB
pH: 6.5 (ref 5.0–8.0)

## 2013-01-26 LAB — BASIC METABOLIC PANEL
BUN: 49 mg/dL — ABNORMAL HIGH (ref 6–23)
CO2: 21 mEq/L (ref 19–32)
Chloride: 111 mEq/L (ref 96–112)
Creatinine, Ser: 2.46 mg/dL — ABNORMAL HIGH (ref 0.50–1.35)
Glucose, Bld: 155 mg/dL — ABNORMAL HIGH (ref 70–99)

## 2013-01-26 LAB — URINE MICROSCOPIC-ADD ON

## 2013-01-26 MED ORDER — SODIUM CHLORIDE 0.9 % IV BOLUS (SEPSIS)
500.0000 mL | Freq: Once | INTRAVENOUS | Status: AC
  Administered 2013-01-26: 500 mL via INTRAVENOUS

## 2013-01-26 MED ORDER — CALCIUM CARBONATE ANTACID 500 MG PO CHEW
1.0000 | CHEWABLE_TABLET | Freq: Two times a day (BID) | ORAL | Status: DC
  Administered 2013-01-27 – 2013-01-29 (×4): 200 mg via ORAL
  Filled 2013-01-26 (×7): qty 1

## 2013-01-26 MED ORDER — SODIUM CHLORIDE 0.9 % IV SOLN
INTRAVENOUS | Status: DC
  Administered 2013-01-27: 1000 mL via INTRAVENOUS

## 2013-01-26 MED ORDER — ATORVASTATIN CALCIUM 20 MG PO TABS
20.0000 mg | ORAL_TABLET | Freq: Every day | ORAL | Status: DC
  Administered 2013-01-27 – 2013-01-28 (×3): 20 mg via ORAL
  Filled 2013-01-26 (×4): qty 1

## 2013-01-26 MED ORDER — SODIUM CHLORIDE 0.9 % IV BOLUS (SEPSIS)
1000.0000 mL | Freq: Once | INTRAVENOUS | Status: AC
  Administered 2013-01-26: 1000 mL via INTRAVENOUS

## 2013-01-26 MED ORDER — DEXTROSE 5 % IV SOLN
1.0000 g | INTRAVENOUS | Status: DC
  Administered 2013-01-27 – 2013-01-28 (×2): 1 g via INTRAVENOUS
  Filled 2013-01-26 (×3): qty 10

## 2013-01-26 MED ORDER — MIRTAZAPINE 15 MG PO TABS
15.0000 mg | ORAL_TABLET | Freq: Every day | ORAL | Status: DC
  Administered 2013-01-27 – 2013-01-28 (×3): 15 mg via ORAL
  Filled 2013-01-26 (×4): qty 1

## 2013-01-26 MED ORDER — POLYETHYLENE GLYCOL 3350 17 G PO PACK
17.0000 g | PACK | Freq: Every day | ORAL | Status: DC | PRN
  Filled 2013-01-26: qty 1

## 2013-01-26 MED ORDER — ASPIRIN 81 MG PO CHEW
81.0000 mg | CHEWABLE_TABLET | Freq: Every morning | ORAL | Status: DC
  Administered 2013-01-28 – 2013-01-29 (×2): 81 mg via ORAL
  Filled 2013-01-26 (×3): qty 1

## 2013-01-26 MED ORDER — TAMSULOSIN HCL 0.4 MG PO CAPS
0.4000 mg | ORAL_CAPSULE | Freq: Every morning | ORAL | Status: DC
  Administered 2013-01-27 – 2013-01-29 (×3): 0.4 mg via ORAL
  Filled 2013-01-26 (×3): qty 1

## 2013-01-26 MED ORDER — CITALOPRAM HYDROBROMIDE 10 MG PO TABS
15.0000 mg | ORAL_TABLET | Freq: Every morning | ORAL | Status: DC
  Administered 2013-01-27 – 2013-01-29 (×3): 15 mg via ORAL
  Filled 2013-01-26 (×3): qty 2

## 2013-01-26 MED ORDER — TIOTROPIUM BROMIDE MONOHYDRATE 18 MCG IN CAPS
18.0000 ug | ORAL_CAPSULE | Freq: Every morning | RESPIRATORY_TRACT | Status: DC
  Administered 2013-01-27 – 2013-01-29 (×2): 18 ug via RESPIRATORY_TRACT
  Filled 2013-01-26: qty 5

## 2013-01-26 MED ORDER — DEXTROSE 5 % IV SOLN
1.0000 g | Freq: Once | INTRAVENOUS | Status: AC
  Administered 2013-01-26: 1 g via INTRAVENOUS
  Filled 2013-01-26: qty 10

## 2013-01-26 MED ORDER — OXYCODONE HCL 5 MG PO TABS
5.0000 mg | ORAL_TABLET | Freq: Four times a day (QID) | ORAL | Status: DC | PRN
Start: 2013-01-26 — End: 2013-01-29
  Administered 2013-01-28: 5 mg via ORAL
  Filled 2013-01-26: qty 1

## 2013-01-26 NOTE — ED Notes (Signed)
Attempt to in and out cath.  Failed attempt.  Order given for caude cath.  Unable to place caude.  Notified MD.  Bladder scan approx 200 cc.  Will wait and attempt to have pt urinate on his own.

## 2013-01-26 NOTE — ED Provider Notes (Signed)
CSN: [id number]     Arrival date & time 01/26/13  1916 History   First MD Initiated Contact with Patient 01/26/13 1929     Chief Complaint  Patient presents with  . Hematuria   (Consider location/radiation/quality/duration/timing/severity/associated sxs/prior Treatment) HPI  This is an 79 year old with history of dementia, hypertension, and coronary disease who presents from his nursing home with hematuria. The staff of the nursing home were changing him and noted blood in his diaper. They were unsure if it was urinary or rectal. Patient has baseline dementia. Per EMS, the staff reported him to be more lethargic and had a lack of appetite.  They deny fevers. He was hemodynamically stable in route and would answer simple questions. Patient is noncontributory to history taking secondary to dementia.  Level V caveat applies  Past Medical History  Diagnosis Date  . Dementia   . Renal disorder   . Pneumococcal pneumonia   . Hypertension   . Encephalopathy acute   . Prostate cancer   . Coronary artery disease    Past Surgical History  Procedure Laterality Date  . Cardiac surgery    . Insertion prostate radiation seed    . Inguinal hernia repair    . Eye surgery      cataract  . Coronary artery bypass graft  1996    [place]   History reviewed. No pertinent family history. History  Substance Use Topics  . Smoking status: Never Smoker   . Smokeless tobacco: Not on file  . Alcohol Use: No    Review of Systems  Unable to perform ROS: Dementia    Allergies  Avelox; Ciprofloxacin; and Floxin  Home Medications   Current Outpatient Rx  Name  Route  Sig  Dispense  Refill  . aspirin 81 MG chewable tablet   Oral   Chew 81 mg by mouth every morning.          Marland Kitchen atorvastatin (LIPITOR) 20 MG tablet   Oral   Take 20 mg by mouth at bedtime.          . calcium carbonate (TUMS - DOSED IN MG ELEMENTAL CALCIUM) 500 MG chewable tablet   Oral   Chew 1 tablet by mouth 2  (two) times daily.         . citalopram (CELEXA) 10 MG tablet   Oral   Take 15 mg by mouth every morning.          . mirtazapine (REMERON) 15 MG tablet   Oral   Take 15 mg by mouth at bedtime.         . Multiple Vitamins-Minerals (CEROVITE SENIOR PO)   Oral   Take 1 capsule by mouth 2 (two) times daily.         Marland Kitchen oxyCODONE (OXY IR/ROXICODONE) 5 MG immediate release tablet   Oral   Take 1 tablet (5 mg total) by mouth every 6 (six) hours as needed for moderate pain.   30 tablet   0   . polyethylene glycol (MIRALAX / GLYCOLAX) packet   Oral   Take 17 g by mouth daily as needed for moderate constipation.          . Tamsulosin HCl (FLOMAX) 0.4 MG CAPS   Oral   Take 0.4 mg by mouth every morning.          . tiotropium (SPIRIVA) 18 MCG inhalation capsule   Inhalation   Place 18 mcg into inhaler and inhale every morning.          Marland Kitchen  trimethoprim (TRIMPEX) 100 MG tablet   Oral   Take 1 tablet (100 mg total) by mouth daily.   30 tablet   0    BP 114/69  Pulse 86  Temp(Src) 98 F (36.7 C) (Rectal)  Resp 18  SpO2 94% Physical Exam  Nursing note and vitals reviewed. Constitutional:  Elderly, no acute distress  HENT:  Head: Normocephalic and atraumatic.  Mucous membranes dry  Eyes: Pupils are equal, round, and reactive to light.  Neck: Neck supple.  Cardiovascular: Normal rate, regular rhythm and normal heart sounds.   No murmur heard. Pulmonary/Chest: Effort normal and breath sounds normal. No respiratory distress.  Abdominal: Soft. Bowel sounds are normal. He exhibits no distension. There is no tenderness.  Genitourinary: Penis normal.  Gross hematuria from the urethral meatus  Musculoskeletal: He exhibits no edema.  Lymphadenopathy:    He has no cervical adenopathy.  Neurological: He is alert.  Alert, disoriented, moves all 4 extremities, follows commands  Skin: Skin is warm and dry.  Psychiatric: He has a normal mood and affect.    ED Course   Procedures (including critical care time) Labs Review Labs Reviewed  CBC WITH DIFFERENTIAL - Abnormal; Notable for the following:    WBC 14.7 (*)    Neutro Abs 11.1 (*)    Monocytes Absolute 1.3 (*)    All other components within normal limits  BASIC METABOLIC PANEL - Abnormal; Notable for the following:    Sodium 146 (*)    Glucose, Bld 155 (*)    BUN 49 (*)    Creatinine, Ser 2.46 (*)    GFR calc non Af Amer 22 (*)    GFR calc Af Amer 26 (*)    All other components within normal limits  URINALYSIS, ROUTINE W REFLEX MICROSCOPIC - Abnormal; Notable for the following:    Color, Urine RED (*)    APPearance TURBID (*)    Specific Gravity, Urine 1.031 (*)    Glucose, UA 250 (*)    Hgb urine dipstick LARGE (*)    Bilirubin Urine LARGE (*)    Ketones, ur >80 (*)    Protein, ur >300 (*)    Urobilinogen, UA >8.0 (*)    Nitrite POSITIVE (*)    Leukocytes, UA LARGE (*)    All other components within normal limits  URINE MICROSCOPIC-ADD ON - Abnormal; Notable for the following:    Bacteria, UA MANY (*)    All other components within normal limits  URINE CULTURE  CULTURE, BLOOD (ROUTINE X 2)  CULTURE, BLOOD (ROUTINE X 2)   Imaging Review No results found.  EKG Interpretation   None       MDM   1. Urinary tract infection   2. Hematuria   3. Acute kidney injury     This is an 79 year old who presents from his nursing home with hematuria. He has obvious evidence of gross blood at his urethral meatus. Patient is noncontributory to history taking. Vital signs are within normal limits and the patient is afebrile. Attempt and out cath the patient was unsuccessful. Urine was obtained by urinal. It is nitrite positive with large leuks and large blood. Patient was given IV Rocephin. He has a white count of 14.6 and acute kidney injury with a creatinine of 2.46. Normal saline bolus was ordered. Patient will be admitted to the hospitalist.   Shon Baton, MD 01/26/13 2144

## 2013-01-26 NOTE — H&P (Signed)
[facility] History and Physical  Laurice Kimmons MWU:[id number] DOB: 02-20-1926 DOA: 01/26/2013  Referring physician: ED PCP: No primary provider on file.  Chief Complaint: Hematuria  HPI: Ruben Morrison is a 79 y.o. 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  Patient presents with frank hematuria.  Family notes patient more lethargic than baseline as well.  Work up in the ED was suggestive of UTI with positive nitrites, large leukocyte esterase.  Foley catheter was not able to be placed, he did produce 50 cc of urine for the UA, bladder scan showed only 200 cc in the bladder.  Work up also revealed AKI.  Review of Systems: unable to perform due to dementia.  Past Medical History  Diagnosis Date  . Dementia   . Renal disorder   . Pneumococcal pneumonia   . Hypertension   . Encephalopathy acute   . Prostate cancer   . Coronary artery disease    Past Surgical History  Procedure Laterality Date  . Cardiac surgery    . Insertion prostate radiation seed    . Inguinal hernia repair    . Eye surgery      cataract  . Coronary artery bypass graft  1996    [place],    Social History:  reports that he has never smoked. He does not have any smokeless tobacco history on file. He reports that he does not drink alcohol or use illicit drugs.   Allergies  Allergen Reactions  . Avelox [Moxifloxacin]     Reaction:Unknown Per medication MAR  . Ciprofloxacin     Unknown reaction- per MAR  . Floxin [Ofloxacin]     Unknown reaction- per [facility]    History reviewed. No pertinent family history.  Prior to Admission medications   Medication Sig Start Date End Date Taking? Authorizing Provider  aspirin 81 MG chewable tablet Chew 81 mg by mouth every morning.    Yes Historical Provider, MD  atorvastatin (LIPITOR) 20 MG tablet Take 20 mg by mouth at  bedtime.    Yes Historical Provider, MD  calcium carbonate (TUMS - DOSED IN MG ELEMENTAL CALCIUM) 500 MG chewable tablet Chew 1 tablet by mouth 2 (two) times daily.   Yes Historical Provider, MD  citalopram (CELEXA) 10 MG tablet Take 15 mg by mouth every morning.    Yes Historical Provider, MD  mirtazapine (REMERON) 15 MG tablet Take 15 mg by mouth at bedtime.   Yes Historical Provider, MD  Multiple Vitamins-Minerals (CEROVITE SENIOR PO) Take 1 capsule by mouth 2 (two) times daily.   Yes Historical Provider, MD  oxyCODONE (OXY IR/ROXICODONE) 5 MG immediate release tablet Take 1 tablet (5 mg total) by mouth every 6 (six) hours as needed for moderate pain. 01/11/13  Yes Tiffany L Reed, DO  polyethylene glycol (MIRALAX / GLYCOLAX) packet Take 17 g by mouth daily as needed for moderate constipation.    Yes Historical Provider, MD  Tamsulosin HCl (FLOMAX) 0.4 MG CAPS Take 0.4 mg by mouth every morning.    Yes Historical Provider, MD  tiotropium (SPIRIVA) 18 MCG inhalation capsule Place 18 mcg into inhaler and inhale every morning.    Yes Historical Provider, MD  trimethoprim (TRIMPEX) 100 MG tablet Take 1 tablet (100 mg total) by mouth daily. 01/07/13   [name], MD   Physical Exam: Filed Vitals:   01/26/13 2025  BP:  Pulse:   Temp: 98 F (36.7 C)  Resp:     General:  NAD, resting comfortably in bed Eyes: PEERLA EOMI ENT: mucous membranes dry Neck: supple w/o JVD Cardiovascular: RRR w/o MRG Respiratory: CTA B Abdomen: soft, nt, nd, bs+, no mass or findings on exam suggestive of distended bladder Skin: no rash nor lesion Musculoskeletal: MAE, full ROM all 4 extremities Psychiatric: normal tone and affect Neurologic: Disoriented, MAE, follows commands  Labs on Admission:  Basic Metabolic Panel:  Recent Labs Lab 01/26/13 2027  NA 146*  K 4.5  CL 111  CO2 21  GLUCOSE 155*  BUN 49*  CREATININE 2.46*  CALCIUM 9.7   Liver Function Tests: No results found for this basename:  AST, ALT, ALKPHOS, BILITOT, PROT, ALBUMIN,  in the last 168 hours No results found for this basename: LIPASE, AMYLASE,  in the last 168 hours No results found for this basename: AMMONIA,  in the last 168 hours CBC:  Recent Labs Lab 01/26/13 2027  WBC 14.7*  NEUTROABS 11.1*  HGB 15.2  HCT 44.6  MCV 98.2  PLT 223   Cardiac Enzymes: No results found for this basename: CKTOTAL, CKMB, CKMBINDEX, TROPONINI,  in the last 168 hours  BNP (last 3 results) No results found for this basename: PROBNP,  in the last 8760 hours CBG: No results found for this basename: GLUCAP,  in the last 168 hours  Radiological Exams on Admission: No results found.  EKG: Independently reviewed.  Assessment/Plan Principal Problem:   Urinary tract infection Active Problems:   Urethral stricture unspecified   AKI (acute kidney injury)   1. UTI - UA findings and elevated WBC suggestive of UTI, treating with rocephin empirically, cultures pending. 2. AKI - most likely pre-renal given the presence of UTI and only 200 cc urine on bladder scan, IVF at 125 cc/hr, renal and bladder ultrasound ordered to rule out obstruction completely, given his admission for obstruction just 20 days ago.    Code Status: Full Code (must indicate code status--if unknown or must be presumed, indicate so) Family Communication: Daughter at bedside (indicate person spoken with, if applicable, with phone number if by telephone) Disposition Plan: Admit to inpatient (indicate anticipated LOS)  Time spent: 70 min  GARDNER, JARED M. [facility] Pager [phone]  If 7PM-7AM, please contact night-coverage [email or web address] Password [facility] 01/26/2013, 10:46 PM

## 2013-01-26 NOTE — ED Notes (Signed)
Bed: WA24 Expected date:  Expected time:  Means of arrival:  Comments: EMS-rectal bleeding 

## 2013-01-26 NOTE — ED Notes (Signed)
Pt able to urinate 50 cc on his own.  Very Red/Bloody urine

## 2013-01-26 NOTE — ED Notes (Signed)
Per EMS, pt from [facility].  Staff were changing him and noted bleeding.  Unsure of location.  ? Urinary vs Rectal.  Pt baseline dementia.  Pt has been more lethargic per staff.  Pt with lack of appetite.  Pt alert and able to answer some questions.  Vitals:  137/91, hr 98, nsr 94 % ra, cbg 169, 20 ga LFA.  4mg  zofran given.

## 2013-01-27 ENCOUNTER — Encounter (HOSPITAL_COMMUNITY): Payer: Self-pay

## 2013-01-27 DIAGNOSIS — F329 Major depressive disorder, single episode, unspecified: Secondary | ICD-10-CM

## 2013-01-27 DIAGNOSIS — N35919 Unspecified urethral stricture, male, unspecified site: Secondary | ICD-10-CM

## 2013-01-27 DIAGNOSIS — E785 Hyperlipidemia, unspecified: Secondary | ICD-10-CM | POA: Diagnosis present

## 2013-01-27 DIAGNOSIS — R319 Hematuria, unspecified: Secondary | ICD-10-CM

## 2013-01-27 DIAGNOSIS — R338 Other retention of urine: Secondary | ICD-10-CM

## 2013-01-27 DIAGNOSIS — F0390 Unspecified dementia without behavioral disturbance: Secondary | ICD-10-CM

## 2013-01-27 DIAGNOSIS — I10 Essential (primary) hypertension: Secondary | ICD-10-CM

## 2013-01-27 LAB — BASIC METABOLIC PANEL
BUN: 40 mg/dL — ABNORMAL HIGH (ref 6–23)
CO2: 23 mEq/L (ref 19–32)
Calcium: 8.3 mg/dL — ABNORMAL LOW (ref 8.4–10.5)
Chloride: 115 mEq/L — ABNORMAL HIGH (ref 96–112)
Creatinine, Ser: 2.18 mg/dL — ABNORMAL HIGH (ref 0.50–1.35)
Potassium: 4.2 mEq/L (ref 3.5–5.1)

## 2013-01-27 LAB — CBC
HCT: 37.6 % — ABNORMAL LOW (ref 39.0–52.0)
MCH: 33.9 pg (ref 26.0–34.0)
MCHC: 34.3 g/dL (ref 30.0–36.0)
MCV: 98.7 fL (ref 78.0–100.0)
Platelets: 191 10*3/uL (ref 150–400)
RBC: 3.81 MIL/uL — ABNORMAL LOW (ref 4.22–5.81)
WBC: 12.1 10*3/uL — ABNORMAL HIGH (ref 4.0–10.5)

## 2013-01-27 MED ORDER — DEXTROSE 5 % IV SOLN
INTRAVENOUS | Status: DC
  Administered 2013-01-27 (×2): via INTRAVENOUS
  Administered 2013-01-28: 100 mL via INTRAVENOUS
  Administered 2013-01-28: 22:00:00 via INTRAVENOUS
  Administered 2013-01-28: 500 mL via INTRAVENOUS
  Administered 2013-01-28 – 2013-01-29 (×2): via INTRAVENOUS

## 2013-01-27 NOTE — Care Management Note (Signed)
   CARE MANAGEMENT NOTE 01/27/2013  Patient:  Ruben Morrison, Ruben Morrison   Account Number:  [id number]  Date Initiated:  01/27/2013  Documentation initiated by:  Carmeline Kowal  Subjective/Objective Assessment:   79 yo male admitted UTI.     Action/Plan:   SNF   Anticipated DC Date:     Anticipated DC Plan:  SKILLED NURSING FACILITY      DC Planning Services  CM consult      Choice offered to / List presented to:  NA   DME arranged  NA      DME agency  NA     HH arranged  NA      HH agency  NA   Status of service:  In process, will continue to follow Medicare Important Message given?   (If response is "NO", the following Medicare IM given date fields will be blank) Date Medicare IM given:   Date Additional Medicare IM given:    Discharge Disposition:    Per UR Regulation:  Reviewed for med. necessity/level of care/duration of stay  If discussed at Long Length of Stay Meetings, dates discussed:    Comments:  01/27/13 1308 Margarit Minshall,RN,MSN [phone] Chart reviewed for utilization of services. Pt from ALF. SNF to be explored. CSW following.

## 2013-01-27 NOTE — Progress Notes (Addendum)
Patient admitted to room 1311. Daughter Amy at bedside. Patient with UTI and ARF. Patient with dementia. Calm and cooperative. Patient incontinent of urine. Will attempt condom catheter. MRSA PCR negative 01/04/2013

## 2013-01-27 NOTE — Progress Notes (Signed)
Unable to keep condom cath on. Have tried x2 since admission. Patient is incontinent of urine.

## 2013-01-27 NOTE — Plan of Care (Signed)
Problem: Phase I Progression Outcomes Goal: Voiding-avoid urinary catheter unless indicated Outcome: Completed/Met Date Met:  01/27/13 Patient is incontinent of urine

## 2013-01-27 NOTE — Progress Notes (Signed)
Patient incontinent of urine, bladder scan completed showing 490 ml post void residual. Will continue to monitor.

## 2013-01-27 NOTE — Consult Note (Signed)
Urology Consult  Referring physician:C Joseph Art Reason for referral: Retention  Chief Complaint: Inability to void and pass catheter  History of Present Illness: Called by Dr. Annabell Howells for above complaint. Known stricture and radiation. Previous ESWL. Serum Cr 2.46. REcent u/sound demonstrates no hydro. Bladder changes as noted suggestive of debris/inflammation/tumor. On November 2nd patient was dilated to 22FR with 16 Fr council catheter for prostatic urethral stricture post radiation seeds. Floor unable to pass a catheter.  Patient not uncomfortable/dementia/voiding since Monday OK according to daughter Modifying factors: There are no other modifying factors  Associated signs and symptoms: There are no other associated signs and symptoms Aggravating and relieving factors: There are no other aggravating or relieving factors Severity: Moderate Duration: Persistent  Past Medical History  Diagnosis Date  . Dementia   . Renal disorder   . Pneumococcal pneumonia   . Hypertension   . Encephalopathy acute   . Prostate cancer   . Coronary artery disease    Past Surgical History  Procedure Laterality Date  . Cardiac surgery    . Insertion prostate radiation seed    . Inguinal hernia repair    . Eye surgery      cataract  . Coronary artery bypass graft  1996    [place], [place]    Medications: I have reviewed the patient's current medications. Allergies:  Allergies  Allergen Reactions  . Avelox [Moxifloxacin]     Reaction:Unknown Per medication MAR  . Ciprofloxacin     Unknown reaction- per MAR  . Floxin [Ofloxacin]     Unknown reaction- per [facility]    History reviewed. No pertinent family history. Social History:  reports that he has never smoked. He has never used smokeless tobacco. He reports that he does not drink alcohol or use illicit drugs.  ROS: All systems are reviewed and negative except as noted. Rest negative  Physical Exam:  Vital signs in last 24 hours: Temp:  [98 F  (36.7 C)-98.2 F (36.8 C)] 98.2 F (36.8 C) (11/26 0434) Pulse Rate:  [72-86] 75 (11/26 0434) Resp:  [16-18] 16 (11/26 0434) BP: (113-116)/(60-69) 116/60 mmHg (11/26 0434) SpO2:  [94 %-100 %] 100 % (11/26 0434) Weight:  [77.111 kg (170 lb)] 77.111 kg (170 lb) (11/25 2254)  Cardiovascular: Skin warm; not flushed Respiratory: Breaths quiet; no shortness of breath Abdomen: No masses Neurological: Normal sensation to touch Musculoskeletal: Normal motor function arms and legs Lymphatics: No inguinal adenopathy Skin: No rashes Genitourinary:normal genitalia/no distress/SOAKED TOWEL WITH URINE AND NO S/P DISTENSION  Laboratory Data:  Results for orders placed during the hospital encounter of 01/26/13 (from the past 72 hour(s))  CBC WITH DIFFERENTIAL     Status: Abnormal   Collection Time    01/26/13  8:27 PM      Result Value Range   WBC 14.7 (*) 4.0 - 10.5 K/uL   RBC 4.54  4.22 - 5.81 MIL/uL   Hemoglobin 15.2  13.0 - 17.0 g/dL   HCT 16.1  09.6 - 04.5 %   MCV 98.2  78.0 - 100.0 fL   MCH 33.5  26.0 - 34.0 pg   MCHC 34.1  30.0 - 36.0 g/dL   RDW 40.9  81.1 - 91.4 %   Platelets 223  150 - 400 K/uL   Neutrophils Relative % 75  43 - 77 %   Lymphocytes Relative 15  12 - 46 %   Monocytes Relative 9  3 - 12 %   Eosinophils Relative 1  0 - 5 %  Basophils Relative 0  0 - 1 %   Neutro Abs 11.1 (*) 1.7 - 7.7 K/uL   Lymphs Abs 2.2  0.7 - 4.0 K/uL   Monocytes Absolute 1.3 (*) 0.1 - 1.0 K/uL   Eosinophils Absolute 0.1  0.0 - 0.7 K/uL   Basophils Absolute 0.0  0.0 - 0.1 K/uL   Smear Review LARGE PLATELETS PRESENT    BASIC METABOLIC PANEL     Status: Abnormal   Collection Time    01/26/13  8:27 PM      Result Value Range   Sodium 146 (*) 135 - 145 mEq/L   Potassium 4.5  3.5 - 5.1 mEq/L   Chloride 111  96 - 112 mEq/L   CO2 21  19 - 32 mEq/L   Glucose, Bld 155 (*) 70 - 99 mg/dL   BUN 49 (*) 6 - 23 mg/dL   Creatinine, Ser 1.61 (*) 0.50 - 1.35 mg/dL   Calcium 9.7  8.4 - 09.6 mg/dL   GFR  calc non Af Amer 22 (*) >90 mL/min   GFR calc Af Amer 26 (*) >90 mL/min   Comment: (NOTE)     The eGFR has been calculated using the CKD EPI equation.     This calculation has not been validated in all clinical situations.     eGFR's persistently <90 mL/min signify possible Chronic Kidney     Disease.  URINALYSIS, ROUTINE W REFLEX MICROSCOPIC     Status: Abnormal   Collection Time    01/26/13  8:27 PM      Result Value Range   Color, Urine RED (*) YELLOW   Comment: BIOCHEMICALS MAY BE AFFECTED BY COLOR   APPearance TURBID (*) CLEAR   Specific Gravity, Urine 1.031 (*) 1.005 - 1.030   pH 6.5  5.0 - 8.0   Glucose, UA 250 (*) NEGATIVE mg/dL   Hgb urine dipstick LARGE (*) NEGATIVE   Bilirubin Urine LARGE (*) NEGATIVE   Ketones, ur >80 (*) NEGATIVE mg/dL   Protein, ur >045 (*) NEGATIVE mg/dL   Urobilinogen, UA >4.0 (*) 0.0 - 1.0 mg/dL   Nitrite POSITIVE (*) NEGATIVE   Leukocytes, UA LARGE (*) NEGATIVE  URINE MICROSCOPIC-ADD ON     Status: Abnormal   Collection Time    01/26/13  8:27 PM      Result Value Range   WBC, UA 7-10  <3 WBC/hpf   RBC / HPF TOO NUMEROUS TO COUNT  <3 RBC/hpf   Bacteria, UA MANY (*) RARE   Urine-Other URINALYSIS PERFORMED ON SUPERNATANT     Comment: MICROSCOPIC EXAM PERFORMED ON UNCONCENTRATED URINE  CBC     Status: Abnormal   Collection Time    01/27/13  6:40 AM      Result Value Range   WBC 12.1 (*) 4.0 - 10.5 K/uL   Comment: WHITE COUNT CONFIRMED ON SMEAR   RBC 3.81 (*) 4.22 - 5.81 MIL/uL   Hemoglobin 12.9 (*) 13.0 - 17.0 g/dL   HCT 98.1 (*) 19.1 - 47.8 %   MCV 98.7  78.0 - 100.0 fL   MCH 33.9  26.0 - 34.0 pg   MCHC 34.3  30.0 - 36.0 g/dL   RDW 29.5  62.1 - 30.8 %   Platelets 191  150 - 400 K/uL  BASIC METABOLIC PANEL     Status: Abnormal   Collection Time    01/27/13  6:40 AM      Result Value Range   Sodium 148 (*) 135 -  145 mEq/L   Potassium 4.2  3.5 - 5.1 mEq/L   Chloride 115 (*) 96 - 112 mEq/L   CO2 23  19 - 32 mEq/L   Glucose, Bld 114  (*) 70 - 99 mg/dL   BUN 40 (*) 6 - 23 mg/dL   Creatinine, Ser 4.09 (*) 0.50 - 1.35 mg/dL   Calcium 8.3 (*) 8.4 - 10.5 mg/dL   GFR calc non Af Amer 26 (*) >90 mL/min   GFR calc Af Amer 30 (*) >90 mL/min   Comment: (NOTE)     The eGFR has been calculated using the CKD EPI equation.     This calculation has not been validated in all clinical situations.     eGFR's persistently <90 mL/min signify possible Chronic Kidney     Disease.   No results found for this or any previous visit (from the past 240 hour(s)). Creatinine:  Recent Labs  01/26/13 2027 01/27/13 0640  CREATININE 2.46* 2.18*    Xrays: See report/chart  SEE ABOve  Impression/Assessment:  Incomplete bladder emptying  Plan: 14 Fr coude failed  Patient then voided blood tinged urine with good stream moderate amount Re scan: PVR 248 ml and in no distress Spoke with daughter: no dilation now/dilate and foley for retention only and not for asymptomatic elevated PVR Will follow  Kynslei Art A 01/27/2013, 12:46 PM

## 2013-01-27 NOTE — Progress Notes (Signed)
[facility] PROGRESS NOTE  Ruben Morrison ZOX:[id number] DOB: Nov 23, 1925 DOA: 01/26/2013 PCP: No primary provider on file.  Assessment/Plan:  UTI -Continue ceftriaxone -Urine cultures pending -Blood culture pending  AKI. -Continue to hydrate aggressively, will change normal saline to D5W  Hypernatremia -DC normal saline and start D5W at 131ml/hr  Urethral stricture -Have consulted [facility] for help with managing of patient's urinary symptoms (placement of Foley cath). -Per nursing 3 attempts at Foley catheterization have failed, most likely secondary to patient's previously known urethral/prostate stricture -Post void ultrasound showed 490 ml of retained urine  HTN -Patient within AHA guidelines no HTN medication indicated (in fact BP slightly soft should improve with hydration)  HLD -Continue Lipitor 20 mg daily  Depression -Continue patient's home regimen of Celexa/Remeron  Acute/chronic Dementia -Stable -Urine and blood cultures pending    Code Status: Full Family Communication: Daughter present for discussion of plan of care Disposition Plan:    Consultants: Urology (Dr. Clayton Bibles)    Procedures:  Renal/urinary tract ultrasound 01/26/2013 No hydronephrosis in the kidneys. Stones identified at prior CT are  not seen sonographically. There is diffuse wall thickening of the  bladder with layering debris and color flow demonstrated. Changes  could represent inflammatory change due to cystitis or transitional  cell neoplasm.    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 Patient presents with frank hematuria. Family notes patient more  lethargic than baseline as well.  Work up in the ED was suggestive of UTI with positive nitrites, large leukocyte esterase. Foley catheter was not able to be placed, he did produce 50 cc of urine for the UA, bladder scan showed only 200 cc in the bladder. Work up also revealed AKI.TODAY patient is lethargic but arousable, and will answer questions. States having negative pain, but after answering questions will immediately fall back asleep. Daughter states that patient had been able to ambulate around SNF on on without help as of last week. Patient had been recently discharged on 01/07/2013 for similar problem of urinary tract infection/urine retention secondary to urethral stricture/prostate stricture. Patient had been discharged to SNF with Foley in place secondary to this problems, however daughter states approximately 2 weeks later physician (unknown name) ordered Foley removed. Daughter confirms that patient had lithotripsy performed on 01/14/2013 by Dr.Vivan Annabell Howells for left ureter stone.   Objective: Filed Vitals:   01/26/13 1931 01/26/13 2025 01/26/13 2254 01/27/13 0434  BP: 114/69  113/69 116/60  Pulse: 86  72 75  Temp: 98 F (36.7 C) 98 F (36.7 C)  98.2 F (36.8 C)  TempSrc: Oral Rectal  Oral  Resp: 18  18 16   SpO2: 94%  96% 100%    Intake/Output Summary (Last 24 hours) at 01/27/13 0746 Last data filed at 01/26/13 2041  Gross per 24 hour  Intake      0 ml  Output     50 ml  Net    -50 ml   There were no vitals filed for this visit.  Exam:   General: A./O. x1, Lethargic, arousable can answer simple questions and attempts to help with exam  Cardiovascular: Regular rate, negative murmurs rubs gallops, DP/PT pulse one plus bilateral   Respiratory: Clear to auscultation bilateral  Abdomen: Soft, suprapubic tenderness to palpation, nondistended plus bowel sounds  Musculoskeletal: Negative pedal edema         Data Reviewed: Basic Metabolic Panel:  Recent Labs Lab  01/26/13 2027 01/27/13 0640  NA 146* 148*  K 4.5 4.2  CL 111 115*  CO2 21 23  GLUCOSE 155* 114*  BUN 49* 40*  CREATININE 2.46* 2.18*  CALCIUM 9.7 8.3*   Liver Function Tests: No results found for this basename: AST, ALT, ALKPHOS, BILITOT, PROT, ALBUMIN,  in the last 168 hours No results found for this basename: LIPASE, AMYLASE,  in the last 168 hours No results found for this basename: AMMONIA,  in the last 168 hours CBC:  Recent Labs Lab 01/26/13 2027 01/27/13 0640  WBC 14.7* 12.1*  NEUTROABS 11.1*  --   HGB 15.2 12.9*  HCT 44.6 37.6*  MCV 98.2 98.7  PLT 223 191   Cardiac Enzymes: No results found for this basename: CKTOTAL, CKMB, CKMBINDEX, TROPONINI,  in the last 168 hours BNP (last 3 results) No results found for this basename: PROBNP,  in the last 8760 hours CBG: No results found for this basename: GLUCAP,  in the last 168 hours  No results found for this or any previous visit (from the past 240 hour(s)).   Studies: US Renal  01/26/2013   CLINICAL DATA:  AKI. Urethral stricture. Retention. History of prostate cancer, hypertension, and stones.  EXAM: RENAL/URINARY TRACT ULTRASOUND COMPLETE  COMPARISON:  CT abdomen and pelvis 01/03/2013  FINDINGS: Right Kidney  Length: 10.1 cm. Echogenicity within normal limits. No mass or hydronephrosis visualized. Stones seen in previous CT are not identified.  Left Kidney  Length: 9.7 cm. Mild parenchymal atrophy and scarring. No hydronephrosis. Stones seen at previous CT are not identified.  Bladder  Diffuse bladder wall thickening with layering debris in the bladder. Suggestion of polypoid structures in the bladder with flow. This could represent inflammatory change or transitional cell neoplasm.  IMPRESSION: No hydronephrosis in the kidneys. Stones identified at prior CT are not seen sonographically. There is diffuse wall thickening of the bladder with layering debris and color flow demonstrated. Changes could represent inflammatory  change due to cystitis or transitional cell neoplasm.   Electronically Signed   By: Burman Nieves M.D.   On: 01/26/2013 23:43    Scheduled Meds: . aspirin  81 mg Oral q morning - 10a  . atorvastatin  20 mg Oral QHS  . calcium carbonate  1 tablet Oral BID  . cefTRIAXone (ROCEPHIN)  IV  1 g Intravenous Q24H  . citalopram  15 mg Oral q morning - 10a  . mirtazapine  15 mg Oral QHS  . tamsulosin  0.4 mg Oral q morning - 10a  . tiotropium  18 mcg Inhalation q morning - 10a   Continuous Infusions: . sodium chloride      Principal Problem:   Urinary tract infection Active Problems:   Urethral stricture unspecified   AKI (acute kidney injury)    Time spent: 50 minutes    WOODS, CURTIS, J  [facility] Pager [phone]. If 7PM-7AM, please contact night-coverage at [email or web address], password [facility] 01/27/2013, 7:46 AM  LOS: 1 day

## 2013-01-27 NOTE — Progress Notes (Signed)
Clinical Social Work Department BRIEF PSYCHOSOCIAL ASSESSMENT 01/27/2013  Patient:  Ruben Morrison, Ruben Morrison     Account Number:  [id number]     Admit date:  01/26/2013  Clinical Social Worker:  Jacelyn Grip  Date/Time:  01/27/2013 11:00 AM  Referred by:  Physician  Date Referred:  01/27/2013 Referred for  ALF Placement  SNF Placement   Other Referral:   Interview type:  Family Other interview type:    PSYCHOSOCIAL DATA Living Status:  FACILITY Admitted from facility:  [facility] Level of care:  Assisted Living Primary support name:  Shawna Orleans Fusselman/[phone] Primary support relationship to patient:  CHILD, ADULT Degree of support available:   strong    CURRENT CONCERNS Current Concerns  Post-Acute Placement   Other Concerns:    SOCIAL WORK ASSESSMENT / PLAN CSW met with pt daughter at bedside. Pt resting comfortably at this time.    Pt daughter confirmed that pt is a resident of [facility] memory care unit. Pt daughter discussed that the patient had a recent hospitalization and discharged to [facility]. The patient's daughter discussed that patient was doing well at [facility] and then discharged from [facility] last Friday and has had a significant decline since he returned to [facility]. Pt daughter discussed that she has not yet seen a doctor and is unclear of what the plan is going to be to treat patient while in the hospital.    The patient daughter recognizes that pt may require SNF placement again, but would like to have further clarification from the doctor of the plan before exploring SNF placement.    CSW requested MD to order PT/OT when pt appropriate to participate.    CSW will continue to follow to assist with disposition needs and continue to assess if pt will be appropriate to return to [facility] or will need SNF placement at discharge.   Assessment/plan status:  Psychosocial Support/Ongoing Assessment of Needs Other  assessment/ plan:   discharge planning   Information/referral to community resources:   pending at this time, awaiting further medical work up and PT/OT evaluations.    PATIENT'S/FAMILY'S RESPONSE TO PLAN OF CARE: Per chart, pt oriented to person only. Pt daughter appears supportive and actively involved in pt care. Pt daughter eager to speak to doctor in order to have further clarification about pt treatment plan. Pt daughter recognizes that SNF may need to be explored.     Jacklynn Lewis, MSW, LCSWA  Clinical Social Work [phone]

## 2013-01-28 LAB — COMPREHENSIVE METABOLIC PANEL
AST: 21 U/L (ref 0–37)
BUN: 22 mg/dL (ref 6–23)
CO2: 21 mEq/L (ref 19–32)
Chloride: 107 mEq/L (ref 96–112)
Creatinine, Ser: 1.35 mg/dL (ref 0.50–1.35)
GFR calc Af Amer: 54 mL/min — ABNORMAL LOW (ref >90)
GFR calc non Af Amer: 46 mL/min — ABNORMAL LOW (ref >90)
Glucose, Bld: 128 mg/dL — ABNORMAL HIGH (ref 70–99)
Potassium: 3.9 mEq/L (ref 3.5–5.1)
Total Bilirubin: 0.6 mg/dL (ref 0.3–1.2)

## 2013-01-28 LAB — URINE CULTURE
Colony Count: NO GROWTH
Culture: NO GROWTH

## 2013-01-28 LAB — CBC WITH DIFFERENTIAL/PLATELET
Basophils Relative: 0 % (ref 0–1)
Eosinophils Absolute: 0.7 10*3/uL (ref 0.0–0.7)
Lymphocytes Relative: 20 % (ref 12–46)
Lymphs Abs: 2.2 10*3/uL (ref 0.7–4.0)
MCH: 33.8 pg (ref 26.0–34.0)
Neutrophils Relative %: 65 % (ref 43–77)
Platelets: 170 10*3/uL (ref 150–400)
RBC: 4.41 MIL/uL (ref 4.22–5.81)
WBC: 11.1 10*3/uL — ABNORMAL HIGH (ref 4.0–10.5)

## 2013-01-28 LAB — MAGNESIUM: Magnesium: 2 mg/dL (ref 1.5–2.5)

## 2013-01-28 NOTE — Progress Notes (Signed)
[facility] PROGRESS NOTE  [name] ZOX:[id number] DOB: 11/11/1925 DOA: 01/26/2013 PCP: No primary provider on file.  Assessment/Plan:  UTI -Continue ceftriaxone -Urine cultures although negative to date we'll continue antibiotics patient is responding (leukocytosis trending down)  -Blood culture pending although negative to date we'll continue antibiotics patient is responding (leukocytosis trending down)   AKI. -Continue to hydrate aggressively, continue D5W  Hypernatremia -Continue  D5W at 122ml/hr  Urethral stricture -Have consulted [facility] for help with managing of patient's urinary symptoms (placement of Foley cath). -Per nursing 3 attempts at Foley catheterization have failed, most likely secondary to patient's previously known urethral/prostate stricture -Post void ultrasound showed 490 ml of retained urine -[facility] does not believe patient requires catheterization at this time but will follow along  HTN -Patient within [facility] guidelines no HTN medication indicated (in fact BP slightly soft should improve with hydration)  HLD -Continue Lipitor 20 mg daily  Depression -Continue patient's home regimen of Celexa/Remeron  Acute/chronic Dementia -Stable -Urine and blood cultures pending    Code Status: Full Family Communication: Daughter present for discussion of plan of care Disposition Plan:    Consultants: Urology (Dr. Clayton Bibles)    Procedures:  Renal/urinary tract ultrasound 01/26/2013 No hydronephrosis in the kidneys. Stones identified at prior CT are  not seen sonographically. There is diffuse wall thickening of the  bladder with layering debris and color flow demonstrated. Changes  could represent inflammatory change due to cystitis or transitional  cell neoplasm.  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 Patient presents with frank hematuria. Family notes patient more lethargic than baseline as well.  Work up in the ED was suggestive of UTI with positive nitrites, large leukocyte esterase. Foley catheter was not able to be placed, he did produce 50 cc of urine for the UA, bladder scan showed only 200 cc in the bladder. Work up also revealed AKI.01/27/2013 patient is lethargic but arousable, and will answer questions. States having negative pain, but after answering questions will immediately fall back asleep. Daughter states that patient had been able to ambulate around SNF on on without help as of last week. Patient had been recently discharged on 01/07/2013 for similar problem of urinary tract infection/urine retention secondary to urethral stricture/prostate stricture. Patient had been discharged to SNF with Foley in place secondary to this problems, however daughter states approximately 2 weeks later physician (unknown name) ordered Foley removed. Daughter confirms that patient had lithotripsy performed on 01/14/2013 by Dr.Sundiata Annabell Howells for left ureter stone. TODAY patient sitting up in bed much more alert obey some commands by daughters, will cooperate to some degree with exam   Objective: Filed Vitals:   01/27/13 1400 01/27/13 2242 01/28/13 0519 01/28/13 1400  BP: 104/66 98/57 102/60 110/60  Pulse: 65 65 60 68  Temp: 97.4 F (36.3 C) 97.6 F (36.4 C) 97.3 F (36.3 C) 97.6 F (36.4 C)  TempSrc: Oral Oral Axillary Oral  Resp: 16 16 16 16   Height:      Weight:      SpO2: 100% 100% 98% 98%    Intake/Output Summary (Last 24 hours) at 01/28/13 1900 Last data filed  at 01/28/13  1400  Gross per 24 hour  Intake   2250 ml  Output      0 ml  Net   2250 ml   Filed Weights   01/26/13 2254  Weight: 77.111 kg (170 lb)    Exam:   General: A./O. x1, can answer simple questions and attempts to help with exam  Cardiovascular: Regular rate, negative murmurs rubs gallops, DP/PT pulse one plus bilateral   Respiratory: Clear to auscultation bilateral   Abdomen: Soft, suprapubic tenderness to palpation, nondistended plus bowel sounds  Musculoskeletal: Negative pedal edema         Data Reviewed: Basic Metabolic Panel:  Recent Labs Lab 01/26/13 2027 01/27/13 0640 01/28/13 1510  NA 146* 148* 139  K 4.5 4.2 3.9  CL 111 115* 107  CO2 21 23 21   GLUCOSE 155* 114* 128*  BUN 49* 40* 22  CREATININE 2.46* 2.18* 1.35  CALCIUM 9.7 8.3* 8.5  MG  --   --  2.0   Liver Function Tests:  Recent Labs Lab 01/28/13 1510  AST 21  ALT 13  ALKPHOS 90  BILITOT 0.6  PROT 5.7*  ALBUMIN 3.0*   No results found for this basename: LIPASE, AMYLASE,  in the last 168 hours No results found for this basename: AMMONIA,  in the last 168 hours CBC:  Recent Labs Lab 01/26/13 2027 01/27/13 0640 01/28/13 1510  WBC 14.7* 12.1* 11.1*  NEUTROABS 11.1*  --  7.2  HGB 15.2 12.9* 14.9  HCT 44.6 37.6* 42.6  MCV 98.2 98.7 96.6  PLT 223 191 170   Cardiac Enzymes: No results found for this basename: CKTOTAL, CKMB, CKMBINDEX, TROPONINI,  in the last 168 hours BNP (last 3 results) No results found for this basename: PROBNP,  in the last 8760 hours CBG: No results found for this basename: GLUCAP,  in the last 168 hours  Recent Results (from the past 240 hour(s))  URINE CULTURE     Status: None   Collection Time    01/26/13  8:27 PM      Result Value Range Status   Specimen Description URINE, RANDOM   Final   Special Requests NONE   Final   Culture  Setup Time     Final   Value: 01/27/2013 03:58     Performed at [facility] Count     Final   Value: NO  GROWTH     Performed at [facility]   Culture     Final   Value: NO GROWTH     Performed at [facility]   Report Status 01/28/2013 FINAL   Final  CULTURE, BLOOD (ROUTINE X 2)     Status: None   Collection Time    01/26/13 10:30 PM      Result Value Range Status   Specimen Description BLOOD RIGHT FOREARM   Final   Special Requests BOTTLES DRAWN AEROBIC AND ANAEROBIC 5CC   Final   Culture  Setup Time     Final   Value: 01/27/2013 04:35     Performed at [facility]   Culture     Final   Value:        BLOOD CULTURE RECEIVED NO GROWTH TO DATE CULTURE WILL BE HELD FOR 5 DAYS BEFORE ISSUING A FINAL NEGATIVE REPORT     Performed at [facility]   Report Status PENDING   Incomplete  CULTURE, BLOOD (ROUTINE X 2)  Status: None   Collection Time    01/26/13 10:40 PM      Result Value Range Status   Specimen Description BLOOD RIGHT WRIST   Final   Special Requests BOTTLES DRAWN AEROBIC AND ANAEROBIC 5CC   Final   Culture  Setup Time     Final   Value: 01/27/2013 04:36     Performed at [facility]   Culture     Final   Value:        BLOOD CULTURE RECEIVED NO GROWTH TO DATE CULTURE WILL BE HELD FOR 5 DAYS BEFORE ISSUING A FINAL NEGATIVE REPORT     Performed at [facility]   Report Status PENDING   Incomplete     Studies: US Renal  01/26/2013   CLINICAL DATA:  AKI. Urethral stricture. Retention. History of prostate cancer, hypertension, and stones.  EXAM: RENAL/URINARY TRACT ULTRASOUND COMPLETE  COMPARISON:  CT abdomen and pelvis 01/03/2013  FINDINGS: Right Kidney  Length: 10.1 cm. Echogenicity within normal limits. No mass or hydronephrosis visualized. Stones seen in previous CT are not identified.  Left Kidney  Length: 9.7 cm. Mild parenchymal atrophy and scarring. No hydronephrosis. Stones seen at previous CT are not identified.  Bladder  Diffuse bladder wall thickening with layering debris in the bladder. Suggestion of polypoid  structures in the bladder with flow. This could represent inflammatory change or transitional cell neoplasm.  IMPRESSION: No hydronephrosis in the kidneys. Stones identified at prior CT are not seen sonographically. There is diffuse wall thickening of the bladder with layering debris and color flow demonstrated. Changes could represent inflammatory change due to cystitis or transitional cell neoplasm.   Electronically Signed   By: Burman Nieves M.D.   On: 01/26/2013 23:43    Scheduled Meds: . aspirin  81 mg Oral q morning - 10a  . atorvastatin  20 mg Oral QHS  . calcium carbonate  1 tablet Oral BID  . cefTRIAXone (ROCEPHIN)  IV  1 g Intravenous Q24H  . citalopram  15 mg Oral q morning - 10a  . mirtazapine  15 mg Oral QHS  . tamsulosin  0.4 mg Oral q morning - 10a  . tiotropium  18 mcg Inhalation q morning - 10a   Continuous Infusions: . dextrose 100 mL (01/28/13 1701)    Principal Problem:   Urinary tract infection Active Problems:   Urethral stricture unspecified   Hypertension   Dementia   AKI (acute kidney injury)   Depression   HLD (hyperlipidemia)    Time spent: 50 minutes    Aziya Arena, J  [facility] Pager [phone]. If 7PM-7AM, please contact night-coverage at [email or web address], password [facility] 01/28/2013, 7:00 PM  LOS: 2 days

## 2013-01-28 NOTE — Progress Notes (Signed)
Bladder scan done, PVR 231. Incontinent of urine,3 towels soaked and wet.Hulda Marin RN

## 2013-01-28 NOTE — Progress Notes (Signed)
Subjective: The patient reports  Total incontinence. 14 F  Coude catheter failed last night. Pt voiding into soaked towels. PVR=248cc last night. Pt comfortable.  Objective: Vital signs in last 24 hours: Temp:  [97.3 F (36.3 C)-97.6 F (36.4 C)] 97.3 F (36.3 C) (11/27 0519) Pulse Rate:  [60-65] 60 (11/27 0519) Resp:  [16] 16 (11/27 0519) BP: (98-104)/(57-66) 102/60 mmHg (11/27 0519) SpO2:  [98 %-100 %] 98 % (11/27 0519)A  Intake/Output from previous day: 11/26 0701 - 11/27 0700 In: 3330.3 [P.O.:240; I.V.:3040.3; IV Piggyback:50] Out: -  Intake/Output this shift:    Past Medical History  Diagnosis Date  . Dementia   . Renal disorder   . Pneumococcal pneumonia   . Hypertension   . Encephalopathy acute   . Prostate cancer   . Coronary artery disease     Physical Exam:  Lungs - Normal respiratory effort, chest expands symmetrically.  Abdomen - Soft, non-tender & non-distended.  Lab Results:  Recent Labs  01/26/13 2027 01/27/13 0640  WBC 14.7* 12.1*  HGB 15.2 12.9*  HCT 44.6 37.6*   BMET  Recent Labs  01/26/13 2027 01/27/13 0640  NA 146* 148*  K 4.5 4.2  CL 111 115*  CO2 21 23  GLUCOSE 155* 114*  BUN 49* 40*  CREATININE 2.46* 2.18*  CALCIUM 9.7 8.3*   No results found for this basename: LABURIN,  in the last 72 hours Results for orders placed during the hospital encounter of 01/26/13  URINE CULTURE     Status: None   Collection Time    01/26/13  8:27 PM      Result Value Range Status   Specimen Description URINE, RANDOM   Final   Special Requests NONE   Final   Culture  Setup Time     Final   Value: 01/27/2013 03:58     Performed at [facility] Count     Final   Value: NO GROWTH     Performed at [facility]   Culture     Final   Value: NO GROWTH     Performed at [facility]   Report Status 01/28/2013 FINAL   Final  CULTURE, BLOOD (ROUTINE X 2)     Status: None   Collection Time    01/26/13 10:30 PM     Result Value Range Status   Specimen Description BLOOD RIGHT FOREARM   Final   Special Requests BOTTLES DRAWN AEROBIC AND ANAEROBIC 5CC   Final   Culture  Setup Time     Final   Value: 01/27/2013 04:35     Performed at [facility]   Culture     Final   Value:        BLOOD CULTURE RECEIVED NO GROWTH TO DATE CULTURE WILL BE HELD FOR 5 DAYS BEFORE ISSUING A FINAL NEGATIVE REPORT     Performed at [facility]   Report Status PENDING   Incomplete  CULTURE, BLOOD (ROUTINE X 2)     Status: None   Collection Time    01/26/13 10:40 PM      Result Value Range Status   Specimen Description BLOOD RIGHT WRIST   Final   Special Requests BOTTLES DRAWN AEROBIC AND ANAEROBIC 5CC   Final   Culture  Setup Time     Final   Value: 01/27/2013 04:36     Performed at [facility]   Culture     Final  Value:        BLOOD CULTURE RECEIVED NO GROWTH TO DATE CULTURE WILL BE HELD FOR 5 DAYS BEFORE ISSUING A FINAL NEGATIVE REPORT     Performed at [facility]   Report Status PENDING   Incomplete    Studies/Results: @[id number] @ pvr 343 this AM. Pt without discomfort.  Assessment: Pt remains comfortable. Multiple failed foley attempts. Will follow. Would need cystoscopy and urethral dilation and foley placement as emergent procedure if foley is necessary.  Plan: Follow.  Mertis Mosher I 01/28/2013, 11:27 AM

## 2013-01-28 NOTE — Progress Notes (Signed)
Bladder scan done after patient had soaked several towels, urine malodorous,pink tinged.Bladder scan showed 343 as PVR. Dr. Patsi Sears made aware of result.Hulda Marin RN

## 2013-01-29 DIAGNOSIS — E785 Hyperlipidemia, unspecified: Secondary | ICD-10-CM

## 2013-01-29 LAB — CBC WITH DIFFERENTIAL/PLATELET
Basophils Relative: 1 % (ref 0–1)
Eosinophils Absolute: 1.2 10*3/uL — ABNORMAL HIGH (ref 0.0–0.7)
Eosinophils Relative: 9 % — ABNORMAL HIGH (ref 0–5)
HCT: 45.1 % (ref 39.0–52.0)
Hemoglobin: 15.7 g/dL (ref 13.0–17.0)
MCH: 34 pg (ref 26.0–34.0)
MCHC: 34.8 g/dL (ref 30.0–36.0)
MCV: 97.6 fL (ref 78.0–100.0)
Monocytes Absolute: 1.2 10*3/uL — ABNORMAL HIGH (ref 0.1–1.0)
Monocytes Relative: 9 % (ref 3–12)
Neutrophils Relative %: 63 % (ref 43–77)

## 2013-01-29 LAB — COMPREHENSIVE METABOLIC PANEL
ALT: 13 U/L (ref 0–53)
AST: 27 U/L (ref 0–37)
CO2: 18 mEq/L — ABNORMAL LOW (ref 19–32)
Calcium: 8.9 mg/dL (ref 8.4–10.5)
Chloride: 104 mEq/L (ref 96–112)
GFR calc non Af Amer: 48 mL/min — ABNORMAL LOW (ref >90)
Sodium: 136 mEq/L (ref 135–145)
Total Protein: 6.2 g/dL (ref 6.0–8.3)

## 2013-01-29 LAB — MAGNESIUM: Magnesium: 2.1 mg/dL (ref 1.5–2.5)

## 2013-01-29 MED ORDER — MIRTAZAPINE 15 MG PO TABS
15.0000 mg | ORAL_TABLET | Freq: Every day | ORAL | Status: DC
  Administered 2013-01-29 – 2013-02-02 (×5): 15 mg via ORAL
  Filled 2013-01-29 (×6): qty 1

## 2013-01-29 MED ORDER — CEFTRIAXONE SODIUM 1 G IJ SOLR
1.0000 g | INTRAMUSCULAR | Status: DC
  Administered 2013-01-29 – 2013-02-01 (×4): 1 g via INTRAVENOUS
  Filled 2013-01-29 (×5): qty 10

## 2013-01-29 MED ORDER — TAMSULOSIN HCL 0.4 MG PO CAPS
0.4000 mg | ORAL_CAPSULE | Freq: Every morning | ORAL | Status: DC
  Administered 2013-01-30 – 2013-02-03 (×5): 0.4 mg via ORAL
  Filled 2013-01-29 (×5): qty 1

## 2013-01-29 MED ORDER — DEXTROSE 5 % IV SOLN
INTRAVENOUS | Status: DC
  Administered 2013-01-29: 18:00:00 via INTRAVENOUS

## 2013-01-29 MED ORDER — ASPIRIN 81 MG PO CHEW
81.0000 mg | CHEWABLE_TABLET | Freq: Every morning | ORAL | Status: DC
  Administered 2013-01-30 – 2013-02-03 (×5): 81 mg via ORAL
  Filled 2013-01-29 (×5): qty 1

## 2013-01-29 MED ORDER — TIOTROPIUM BROMIDE MONOHYDRATE 18 MCG IN CAPS
18.0000 ug | ORAL_CAPSULE | Freq: Every morning | RESPIRATORY_TRACT | Status: DC
  Administered 2013-01-30 – 2013-02-03 (×4): 18 ug via RESPIRATORY_TRACT
  Filled 2013-01-29 (×2): qty 5

## 2013-01-29 MED ORDER — OXYCODONE HCL 5 MG PO TABS
5.0000 mg | ORAL_TABLET | Freq: Four times a day (QID) | ORAL | Status: DC | PRN
  Administered 2013-01-31: 5 mg via ORAL
  Filled 2013-01-29: qty 1

## 2013-01-29 MED ORDER — ATORVASTATIN CALCIUM 20 MG PO TABS
20.0000 mg | ORAL_TABLET | Freq: Every day | ORAL | Status: DC
  Administered 2013-01-29 – 2013-02-02 (×5): 20 mg via ORAL
  Filled 2013-01-29 (×6): qty 1

## 2013-01-29 MED ORDER — CALCIUM CARBONATE ANTACID 500 MG PO CHEW
1.0000 | CHEWABLE_TABLET | Freq: Two times a day (BID) | ORAL | Status: DC
  Administered 2013-01-29 – 2013-02-03 (×10): 200 mg via ORAL
  Filled 2013-01-29 (×12): qty 1

## 2013-01-29 MED ORDER — CITALOPRAM HYDROBROMIDE 10 MG PO TABS
15.0000 mg | ORAL_TABLET | Freq: Every morning | ORAL | Status: DC
  Administered 2013-01-30 – 2013-02-03 (×5): 15 mg via ORAL
  Filled 2013-01-29 (×5): qty 2

## 2013-01-29 MED ORDER — POLYETHYLENE GLYCOL 3350 17 G PO PACK
17.0000 g | PACK | Freq: Every day | ORAL | Status: DC | PRN
  Filled 2013-01-29: qty 1

## 2013-01-29 NOTE — Progress Notes (Signed)
[facility] PROGRESS NOTE  Javeion Cannedy NWG:[id number] DOB: Feb 06, 1926 DOA: 01/26/2013 PCP: No primary provider on file.  Assessment/Plan:  UTI -Continue ceftriaxone( WBC did bump up todaycont. monitor) -Urine cultures although negative to date we'll continue antibiotics patient is responding (leukocytosis trending down)  -Blood culture pending although negative to date we'll continue antibiotics patient is responding (leukocytosis trending down)   AKI. -Continue to hydrate aggressively, continue D5W  Hypernatremia -Continue  D5W at 177ml/hr  Urethral stricture -Have consulted [facility] for help with managing of patient's urinary symptoms (placement of Foley cath). -Per nursing 3 attempts at Foley catheterization have failed, most likely secondary to patient's previously known urethral/prostate stricture -Post void ultrasound showed 490 ml of retained urine -[facility] does not believe patient requires catheterization at this time but will follow along -Pt cont to void well on own  HTN -Patient within [facility]HA guidelines no HTN medication indicated   HLD -Continue Lipitor 20 mg daily  Depression -Continue patient's home regimen of Celexa/Remeron  Acute/chronic Dementia -Stable -Urine and blood cultures pending; see below for results    Code Status: Full Family Communication: Daughter present for discussion of plan of care Disposition Plan:    Consultants: Urology (Dr. Clayton Bibles)    Procedures:  Renal/urinary tract ultrasound 01/26/2013 No hydronephrosis in the kidneys. Stones identified at prior CT are  not seen sonographically. There is diffuse wall thickening of the  bladder with layering debris and color flow demonstrated. Changes  could represent inflammatory change due to cystitis or transitional  cell neoplasm.  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 Patient presents with frank hematuria. Family notes patient more lethargic than baseline as well.  Work up in the ED was suggestive of UTI with positive nitrites, large leukocyte esterase. Foley catheter was not able to be placed, he did produce 50 cc of urine for the UA, bladder scan showed only 200 cc in the bladder. Work up also revealed AKI.01/27/2013 patient is lethargic but arousable, and will answer questions. States having negative pain, but after answering questions will immediately fall back asleep. Daughter states that patient had been able to ambulate around SNF on on without help as of last week. Patient had been recently discharged on 01/07/2013 for similar problem of urinary tract infection/urine retention secondary to urethral stricture/prostate stricture. Patient had been discharged to SNF with Foley in place secondary to this problems, however daughter states approximately 2 weeks later physician (unknown name) ordered Foley removed. Daughter confirms that patient had lithotripsy performed on 01/14/2013 by Dr.Rahkeem Annabell Howells for left ureter stone. 01/29/2003 patient sitting up in bed much more alert obey some commands by daughters, will cooperate to some degree with exam TODAY patient asleep but arousable, can answer simple direct questions. Can participate in exam limited degree.    Objective: Filed Vitals:   01/28/13 0519 01/28/13 1400 01/28/13 2100 01/29/13 0536  BP: 102/60 110/60 104/63 113/71  Pulse: 60 68 74 64  Temp: 97.3 F (36.3 C) 97.6 F (36.4 C) 98.6 F (37 C) 97.5 F (36.4 C)  TempSrc: Axillary Oral Oral Oral  Resp: 16 16    Height:  Weight:      SpO2:  98% 98% 100% 100%    Intake/Output Summary (Last 24 hours) at 01/29/13 0740 Last data filed at 01/29/13 0539  Gross per 24 hour  Intake    875 ml  Output    600 ml  Net    275 ml   Filed Weights   01/26/13 2254  Weight: 77.111 kg (170 lb)    Exam:   General: A./O. x1, can answer simple questions and attempts to help with exam. More interactive  Cardiovascular: Regular rate, negative murmurs rubs gallops, DP/PT pulse one plus bilateral   Respiratory: Clear to auscultation bilateral   Abdomen: Soft, suprapubic tenderness to palpation, nondistended, plus bowel sounds  Musculoskeletal: Negative pedal edema         Data Reviewed: Basic Metabolic Panel:  Recent Labs Lab 01/26/13 2027 01/27/13 0640 01/28/13 1510 01/29/13 0340  NA 146* 148* 139 136  K 4.5 4.2 3.9 4.1  CL 111 115* 107 104  CO2 21 23 21  18*  GLUCOSE 155* 114* 128* 130*  BUN 49* 40* 22 17  CREATININE 2.46* 2.18* 1.35 1.31  CALCIUM 9.7 8.3* 8.5 8.9  MG  --   --  2.0 2.1   Liver Function Tests:  Recent Labs Lab 01/28/13 1510 01/29/13 0340  AST 21 27  ALT 13 13  ALKPHOS 90 97  BILITOT 0.6 0.5  PROT 5.7* 6.2  ALBUMIN 3.0* 3.0*   No results found for this basename: LIPASE, AMYLASE,  in the last 168 hours No results found for this basename: AMMONIA,  in the last 168 hours CBC:  Recent Labs Lab 01/26/13 2027 01/27/13 0640 01/28/13 1510 01/29/13 0340  WBC 14.7* 12.1* 11.1* 13.2*  NEUTROABS 11.1*  --  7.2 8.3*  HGB 15.2 12.9* 14.9 15.7  HCT 44.6 37.6* 42.6 45.1  MCV 98.2 98.7 96.6 97.6  PLT 223 191 170 185   Cardiac Enzymes: No results found for this basename: CKTOTAL, CKMB, CKMBINDEX, TROPONINI,  in the last 168 hours BNP (last 3 results) No results found for this basename: PROBNP,  in the last 8760 hours CBG: No results found for this basename: GLUCAP,  in the last 168 hours  Recent Results (from the past 240 hour(s))  URINE CULTURE     Status: None   Collection Time     01/26/13  8:27 PM      Result Value Range Status   Specimen Description URINE, RANDOM   Final   Special Requests NONE   Final   Culture  Setup Time     Final   Value: 01/27/2013 03:58     Performed at [facility] Count     Final   Value: NO GROWTH     Performed at [facility]   Culture     Final   Value: NO GROWTH     Performed at [facility]   Report Status 01/28/2013 FINAL   Final  CULTURE, BLOOD (ROUTINE X 2)     Status: None   Collection Time    01/26/13 10:30 PM      Result Value Range Status   Specimen Description BLOOD RIGHT FOREARM   Final   Special Requests BOTTLES DRAWN AEROBIC AND ANAEROBIC 5CC   Final   Culture  Setup Time     Final   Value: 01/27/2013 04:35     Performed at [facility]   Culture     Final  Value:        BLOOD CULTURE RECEIVED NO GROWTH TO DATE CULTURE WILL BE HELD FOR 5 DAYS BEFORE ISSUING A FINAL NEGATIVE REPORT     Performed at [facility]   Report Status PENDING   Incomplete  CULTURE, BLOOD (ROUTINE X 2)     Status: None   Collection Time    01/26/13 10:40 PM      Result Value Range Status   Specimen Description BLOOD RIGHT WRIST   Final   Special Requests BOTTLES DRAWN AEROBIC AND ANAEROBIC 5CC   Final   Culture  Setup Time     Final   Value: 01/27/2013 04:36     Performed at [facility]   Culture     Final   Value:        BLOOD CULTURE RECEIVED NO GROWTH TO DATE CULTURE WILL BE HELD FOR 5 DAYS BEFORE ISSUING A FINAL NEGATIVE REPORT     Performed at [facility]   Report Status PENDING   Incomplete     Studies: No results found.  Scheduled Meds: . aspirin  81 mg Oral q morning - 10a  . atorvastatin  20 mg Oral QHS  . calcium carbonate  1 tablet Oral BID  . cefTRIAXone (ROCEPHIN)  IV  1 g Intravenous Q24H  . citalopram  15 mg Oral q morning - 10a  . mirtazapine  15 mg Oral QHS  . tamsulosin  0.4 mg Oral q morning - 10a  . tiotropium  18 mcg Inhalation q morning  - 10a   Continuous Infusions: . dextrose 100 mL/hr at 01/28/13 2143    Principal Problem:   Urinary tract infection Active Problems:   Urethral stricture unspecified   Hypertension   Dementia   AKI (acute kidney injury)   Depression   HLD (hyperlipidemia)    Time spent: 35 minutes    WOODS, CURTIS, J  [facility] Pager [phone]. If 7PM-7AM, please contact night-coverage at [email or web address], password [facility] 01/29/2013, 7:40 AM  LOS: 3 days

## 2013-01-29 NOTE — Evaluation (Signed)
Physical Therapy Evaluation Patient Details Name: Ruben Morrison MRN: [id number] DOB: 1925/10/23 Today's Date: 01/29/2013 Time: 4098-1191 PT Time Calculation (min): 17 min  PT Assessment / Plan / Recommendation History of Present Illness  Ruben Morrison is a 79 y.o. 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  Patient presents with frank hematuria.  Family notes patient more lethargic than baseline as well.  Clinical Impression  On eval, pt required +2 assist for mobility-able to stand briefly but was unable to take any steps. Recommend return to SNF.     PT Assessment  Patient needs continued PT services    Follow Up Recommendations  SNF    Does the patient have the potential to tolerate intense rehabilitation      Barriers to Discharge        Equipment Recommendations  None recommended by PT    Recommendations for Other Services OT consult   Frequency Min 3X/week    Precautions / Restrictions Precautions Precautions: Fall Precaution Comments: dementia Restrictions Weight Bearing Restrictions: No   Pertinent Vitals/Pain No c/o pain      Mobility  Bed Mobility Bed Mobility: Supine to Sit;Sit to Supine Supine to Sit: 1: +2 Total assist Supine to Sit: Patient Percentage: 10% Sit to Supine: 1: +2 Total assist Sit to Supine: Patient Percentage: 20% Details for Bed Mobility Assistance: use  of bed pad to scoot hips around to EOB, assist for LEs off the bed, and manual assist to guide L UE for rail to try to help pull self around to EOB.  Transfers Transfers: Sit to Stand;Stand to Sit Sit to Stand: 1: +2 Total assist Sit to Stand: Patient Percentage: 40% Stand to Sit: 1: +2 Total assist Stand to Sit: Patient Percentage: 10% Details for Transfer Assistance: assist to rise and steady and also assist to help swing hips toward HOB to sit  as pt couldnt help with side step toward HOB.  Ambulation/Gait Ambulation/Gait Assistance: Not tested (comment) Ambulation/Gait Assistance Details: pt unable    Exercises     PT Diagnosis: Difficulty walking;Abnormality of gait;Generalized weakness;Altered mental status  PT Problem List: Decreased strength;Decreased range of motion;Decreased mobility;Decreased balance;Decreased knowledge of use of DME;Decreased activity tolerance;Decreased cognition PT Treatment Interventions: DME instruction;Gait training;Functional mobility training;Therapeutic activities;Therapeutic exercise;Patient/family education;Balance training     PT Goals(Current goals can be found in the care plan section) Acute Rehab PT Goals Patient Stated Goal: not able to state PT Goal Formulation: Patient unable to participate in goal setting Time For Goal Achievement: 02/12/13 Potential to Achieve Goals: Fair  Visit Information  Last PT Received On: 01/29/13 Assistance Needed: +2 PT/OT Co-Evaluation/Treatment: Yes History of Present Illness: Ruben Morrison is a 79 y.o. 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  Patient presents with frank hematuria.  Family notes patient more lethargic than baseline as well.       Prior Functioning  Home Living Family/patient expects to be discharged to:: Skilled nursing facility Home Equipment: Dan Humphreys - 2 wheels;Shower seat;Wheelchair - manual;Grab bars - toilet;Grab bars - tub/shower Additional Comments: Home equipment already in chart from 3 weeks ago. No family present today. Pt from ALF memory care unit.  Prior Function Level of Independence: Needs assistance Comments: no family present to confirm most recently level  of independence. Per chart from 3 weeks ago stated occassional assist with ambulation and assist for ADL.   Communication Communication: Other (comment) (doesnt verballize much)    Cognition  Cognition Arousal/Alertness: Awake/alert Behavior During Therapy: WFL for tasks assessed/performed Overall Cognitive Status: No family/caregiver present to determine baseline cognitive functioning    Extremity/Trunk Assessment Upper Extremity Assessment Upper Extremity Assessment: Defer to OT evaluation Lower Extremity Assessment Lower Extremity Assessment: Generalized weakness Cervical / Trunk Assessment Cervical / Trunk Assessment: Normal   Balance Balance Balance Assessed: Yes Static Standing Balance Static Standing - Balance Support: Bilateral upper extremity supported Static Standing - Level of Assistance: 4: Min assist  End of Session PT - End of Session Equipment Utilized During Treatment: Gait belt Activity Tolerance: Patient limited by fatigue Patient left: in bed;with call bell/phone within reach;with bed alarm set Nurse Communication: Mobility status  GP     Rebeca Alert, MPT Pager: [phone]

## 2013-01-29 NOTE — Progress Notes (Signed)
CSW met with pt wife, and patient daughters who stated they are interested in skilled nursing for patient. Per nurse, patient is a 2 person assist. CSW awaiting pt evaluation to assist with insurance authorization. CSW will iniate snf search while waiting for physical therapy. Please see placement note for progress of placement.   Catha Gosselin, LCSW [phone]  ED CSW .01/29/2013 10:16am

## 2013-01-29 NOTE — Progress Notes (Signed)
Covering CSW following patient. Per chart review, patient form [facility]. Patient recently at snf previouslly. Per chart review, pt will need pt/ot consult to determine disposition needs. CSW informed attending of needed pt consult.  CSW awaiting further evaluation to determine pt discharge needs.   Catha Gosselin, [place] [phone]  ED CSW .01/29/2013 851am

## 2013-01-29 NOTE — Evaluation (Signed)
Occupational Therapy Evaluation Patient Details Name: Ruben Morrison MRN: [id number] DOB: 18-Jan-1926 Today's Date: 01/29/2013 Time: 6213-0865 OT Time Calculation (min): 17 min  OT Assessment / Plan / Recommendation History of present illness Ruben Morrison is a 79 y.o. 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  Patient presents with frank hematuria.  Family notes patient more lethargic than baseline as well.   Clinical Impression   Pt limited on eval by decreased cognition. Not sure of pt's baseline as no family present to provide PLOF or baseline cognition. Per chart from memory care unit. Will see for trial of OT to decrease caregiver burden and work toward better independence with self care.    OT Assessment  Patient needs continued OT Services    Follow Up Recommendations  SNF;Supervision/Assistance - 24 hour    Barriers to Discharge      Equipment Recommendations  None recommended by OT    Recommendations for Other Services    Frequency  Min 2X/week    Precautions / Restrictions Precautions Precautions: Fall Restrictions Weight Bearing Restrictions: No   Pertinent Vitals/Pain Pt stated "no" when asked about pain    ADL  Grooming: Simulated;Wash/dry face;Maximal assistance Where Assessed - Grooming: Supine, head of bed up Upper Body Bathing: Simulated;Chest;Right arm;Left arm;Abdomen;+1 Total assistance Where Assessed - Upper Body Bathing: Unsupported sitting Lower Body Bathing: Simulated;+2 Total assistance Lower Body Bathing: Patient Percentage: 10% Where Assessed - Lower Body Bathing: Supported sit to stand Upper Body Dressing: Simulated;+1 Total assistance Where Assessed - Upper Body Dressing: Unsupported sitting Lower Body Dressing: Simulated;+2 Total assistance Lower Body Dressing: Patient Percentage: 0% Where Assessed -  Lower Body Dressing: Supported sit to stand Toilet Transfer: Simulated;+2 Total assistance Toilet Transfer: Patient Percentage: 40% Statistician Method: Sit to stand Toileting - Architect and Hygiene: Simulated;+2 Total assistance Toileting - Architect and Hygiene: Patient Percentage: 0% Where Assessed - Glass blower/designer Manipulation and Hygiene: Standing Equipment Used: Rolling walker ADL Comments: Pt only verbalized only a few times during session and with simple yes or no. Did not state his name when asked. Pleasant and smiled at times. Required max multimodal cues to follow commands. Difficulty moving feet once in standing and required manual assist to move R LE to the side. Ended up having to swing hips to the R to sit higher up on EOB as pt couldnt side step. No family present for PLOF information.     OT Diagnosis: Generalized weakness;Cognitive deficits  OT Problem List: Decreased strength;Decreased knowledge of use of DME or AE;Decreased cognition OT Treatment Interventions: Self-care/ADL training;DME and/or AE instruction;Therapeutic activities;Patient/family education   OT Goals(Current goals can be found in the care plan section) Acute Rehab OT Goals Patient Stated Goal: not able to state OT Goal Formulation: Patient unable to participate in goal setting Time For Goal Achievement: 02/12/13 Potential to Achieve Goals: Good  Visit Information  Last OT Received On: 01/29/13 Assistance Needed: +2 PT/OT Co-Evaluation/Treatment: Yes History of Present Illness: Ruben Morrison is a 79 y.o. 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  Patient presents with frank hematuria.  Family notes patient more lethargic than baseline as well.       Prior Functioning     Home Living Family/patient expects to be  discharged to::  Assisted living Home Equipment: Dan Humphreys - 2 wheels;Shower seat;Wheelchair - manual;Grab bars - toilet;Grab bars - tub/shower Additional Comments: Home equipment already in chart from 3 weeks ago. No family present today. Pt from ALF memory care unit.  Prior Function Comments: no family present to confirm most recently level of independence. Per chart from 3 weeks ago stated occassional assist with ambulation and assist for ADL.  Communication Communication: Other (comment) (doesnt verbalize much)         Vision/Perception     Cognition  Cognition Arousal/Alertness: Awake/alert Behavior During Therapy: WFL for tasks assessed/performed Overall Cognitive Status: No family/caregiver present to determine baseline cognitive functioning (history of dementia)    Extremity/Trunk Assessment Upper Extremity Assessment Upper Extremity Assessment: Generalized weakness (AAROM to bilateral UE, note stiffness)     Mobility Bed Mobility Bed Mobility: Supine to Sit;Sit to Supine Supine to Sit: 1: +2 Total assist Supine to Sit: Patient Percentage: 10% Sit to Supine: 1: +2 Total assist Sit to Supine: Patient Percentage: 20% Details for Bed Mobility Assistance: use  of bed pad to scoot hips around to EOB, assist for LEs off the bed, and manual assist to guide L UE for rail to try to help pull self around to EOB.  Transfers Transfers: Sit to Stand;Stand to Sit Sit to Stand: 1: +2 Total assist;With upper extremity assist;From bed Sit to Stand: Patient Percentage: 40% Stand to Sit: 1: +2 Total assist;To bed;With upper extremity assist Stand to Sit: Patient Percentage: 10% Details for Transfer Assistance: assist to rise and steady and also assist to help swing hips toward HOB to sit as pt couldnt help with side step toward HOB.      Exercise     Balance     End of Session OT - End of Session Equipment Utilized During Treatment: Rolling walker Activity Tolerance: Other (comment)  (cognition) Patient left: in bed;with call bell/phone within reach;with bed alarm set  GO     Ruben Morrison [phone] 01/29/2013, 12:40 PM

## 2013-01-29 NOTE — Progress Notes (Signed)
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 Unable to pass 8F Coude per Dr. Isidoro Donning. Now voiding with condom cath. Also post lithotripsy per Dr. Alveria Apley.   Subjective:     No acute urologic events overnight. Ambulation:   negative Flatus:    positive Bowel movement  positive  Pain: some relief  Objective:  Blood pressure 113/71, pulse 64, temperature 97.5 F (36.4 C), temperature source Oral, resp. rate 16, height 5\' 8"  (1.727 m), weight 77.111 kg (170 lb), SpO2 100.00%.  Physical Exam:  General:  No acute distress, awake Extremities: Homans sign is negative, no sign of DVT Genitourinary:  Normal penis Foley: condom cath    I/O last 3 completed shifts: In: 2250 [P.O.:240; I.V.:1960; IV Piggyback:50] Out: 600 [Urine:600]  Recent Labs     01/28/13  1510  01/29/13  0340  HGB  14.9  15.7  WBC  11.1*  13.2*  PLT  170  185    Recent Labs     01/28/13  1510  01/29/13  0340  NA  139  136  K  3.9  4.1  CL  107  104  CO2  21  18*  BUN  22  17  CREATININE  1.35  1.31  CALCIUM  8.5  8.9  GFRNONAA  46*  48*  GFRAA  54*  56*     No results found for this basename: PT, INR, APTT,  in the last 72 hours   No components found with this basename: ABG,   Assessment/Plan:  Wound care discussed: condom cath discussed with  nurses.  Will notify  Dr. Annabell Howells for future f/u./

## 2013-01-29 NOTE — Progress Notes (Addendum)
Clinical Social Work Department CLINICAL SOCIAL WORK PLACEMENT NOTE 01/29/2013  Patient:  IVIE, MAESE  Account Number:  [id number] Admit date:  01/26/2013  Clinical Social Worker:  Doree Albee  Date/time:  01/29/2013 03:26 PM  Clinical Social Work is seeking post-discharge placement for this patient at the following level of care:   SKILLED NURSING   (*CSW will update this form in Epic as items are completed)   01/29/2013  Patient/family provided with [facility] Department of Clinical Social Work's list of facilities offering this level of care within the geographic area requested by the patient (or if unable, by the patient's family).  01/29/2013  Patient/family informed of their freedom to choose among providers that offer the needed level of care, that participate in Medicare, Medicaid or managed care program needed by the patient, have an available bed and are willing to accept the patient.  01/29/2013  Patient/family informed of [facility]' ownership interest in [facility], as well as of the fact that they are under no obligation to receive care at this facility.  PASARR submitted to [employer] on 01/29/2013 PASARR number received from [employer] on 01/29/2013  FL2 transmitted to all facilities in geographic area requested by pt/family on  01/29/2013 FL2 transmitted to all facilities within larger geographic area on   Patient informed that his/her managed care company has contracts with or will negotiate with  certain facilities, including the following:     Patient/family informed of bed offers received:  01/31/2013 Patient chooses bed at [facility] Physician recommends and patient chooses bed at    Patient to be transferred to  on  [facility] on 02/03/2013 Patient to be transferred to facility by ambulance Sharin Mons)  The following physician request were entered in Epic:   Additional Comments: .Frutoso Schatz [phone]  ED CSW .01/29/2013 1527pm     Jacklynn Lewis, MSW, LCSWA  Clinical Social Work [phone]

## 2013-01-30 LAB — COMPREHENSIVE METABOLIC PANEL
ALT: 23 U/L (ref 0–53)
AST: 30 U/L (ref 0–37)
BUN: 16 mg/dL (ref 6–23)
CO2: 23 mEq/L (ref 19–32)
Calcium: 9.2 mg/dL (ref 8.4–10.5)
Chloride: 105 mEq/L (ref 96–112)
Creatinine, Ser: 1.4 mg/dL — ABNORMAL HIGH (ref 0.50–1.35)
GFR calc Af Amer: 51 mL/min — ABNORMAL LOW (ref >90)
GFR calc non Af Amer: 44 mL/min — ABNORMAL LOW (ref >90)
Glucose, Bld: 108 mg/dL — ABNORMAL HIGH (ref 70–99)
Sodium: 140 mEq/L (ref 135–145)
Total Bilirubin: 0.5 mg/dL (ref 0.3–1.2)
Total Protein: 6.3 g/dL (ref 6.0–8.3)

## 2013-01-30 LAB — CBC
HCT: 45.7 % (ref 39.0–52.0)
MCH: 34 pg (ref 26.0–34.0)
Platelets: 180 10*3/uL (ref 150–400)
RBC: 4.71 MIL/uL (ref 4.22–5.81)
WBC: 12.7 10*3/uL — ABNORMAL HIGH (ref 4.0–10.5)

## 2013-01-30 LAB — MAGNESIUM: Magnesium: 2.1 mg/dL (ref 1.5–2.5)

## 2013-01-30 MED ORDER — DEXTROSE-NACL 5-0.45 % IV SOLN
INTRAVENOUS | Status: DC
  Administered 2013-01-30 – 2013-02-02 (×7): via INTRAVENOUS

## 2013-01-30 NOTE — Progress Notes (Signed)
Urology Progress Note  Subjective:     No acute urologic events overnight. Patient denies SP discomfort or difficulty voiding. Able to awaken patient, but he is sleepy.  ROS: Negative: chest pain or SOB.  Objective:  Patient Vitals for the past 24 hrs:  BP Temp Temp src Pulse Resp SpO2  01/30/13 0645 109/62 mmHg - - 69 - -  01/30/13 0457 174/96 mmHg 97.1 F (36.2 C) Axillary 91 20 98 %  01/29/13 2107 118/71 mmHg 98.9 F (37.2 C) Oral 81 16 100 %  01/29/13 1323 115/70 mmHg 98 F (36.7 C) Oral 68 18 98 %    Physical Exam: General:  No acute distress, awake Cardiovascular:    [x]   S1/S2 present, RRR  []   Irregularly irregular Chest:  CTA-B Abdomen:               []  Soft, appropriately TTP  [x]  Soft, NTTP, bladder not palpable.  []  Soft, appropriately TTP, incision(s) clean/dry/intact  Genitourinary: Condom cath in place. Foley bag w/ >500cc of urine which is tea colored. Negative clots. (This appears to be baseline for this hospitalization according to Dr. Mina Marble note and PT not from yesterday).     I/O last 3 completed shifts: In: 2911 [P.O.:415; I.V.:2496] Out: 2050 [Urine:2050]  Recent Labs     01/29/13  0340  01/30/13  0400  HGB  15.7  16.0  WBC  13.2*  12.7*  PLT  185  180    Recent Labs     01/29/13  0340  01/30/13  0400  NA  136  140  K  4.1  3.8  CL  104  105  CO2  18*  23  BUN  17  16  CREATININE  1.31  1.40*  CALCIUM  8.9  9.2  GFRNONAA  48*  44*  GFRAA  56*  51*     No results found for this basename: PT, INR, APTT,  in the last 72 hours   No components found with this basename: ABG,     Length of stay: 4 days.  Assessment: Urethral stricture. UTI.    Plan: -Watch renal function. -No indication for surgical intervention for stricture at this point. -Will follow.   Natalia Leatherwood, MD [phone]

## 2013-01-30 NOTE — Progress Notes (Signed)
[facility] PROGRESS NOTE  Ruben Morrison FAO:[id number] DOB: 1925-12-26 DOA: 01/26/2013 PCP: No primary provider on file.  Assessment/Plan:  UTI -Continue ceftriaxone -Urine cultures although negative to date will continue antibiotics patient is responding (leukocytosis trending down)  -Blood culture pending although negative to date will continue antibiotics patient is responding (leukocytosis trending down)  -ON to patient's propensity for frequent bounced back for UTI would keep patient until his leukocytosis fully resolved, and see what the final recommendations from urology are concerning his hematuria (cystoscopy?)  AKI. -Continue to hydrate aggressively, continue D5W  Hypernatremia -Continue  D5W at 171ml/hr  Urethral stricture -Have consulted [facility] for help with managing of patient's urinary symptoms (placement of Foley cath). -Per nursing 3 attempts at Foley catheterization have failed, most likely secondary to patient's previously known urethral/prostate stricture -Post void ultrasound showed 490 ml of retained urine -[facility] does not believe patient requires catheterization at this time but will follow along -Pt cont to void well on own but continues to be hematuria  HTN -Patient within AHA guidelines no HTN medication indicated   HLD -Continue Lipitor 20 mg daily  Depression -Continue patient's home regimen of Celexa/Remeron  Acute/chronic Dementia -Stable -Urine and blood cultures pending; see below for results    Code Status: Full Family Communication: Daughter present for discussion of plan of care Disposition Plan:    Consultants: Urology (Dr. Clayton Bibles)    Procedures:   Renal/urinary tract ultrasound 01/26/2013 No hydronephrosis in the kidneys. Stones identified at prior CT are  not seen sonographically. There is diffuse wall thickening of the  bladder with layering debris and color flow demonstrated. Changes   could represent inflammatory change due to cystitis or transitional  cell neoplasm.  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 Patient presents with frank hematuria. Family notes patient more lethargic than baseline as well.  Work up in the ED was suggestive of UTI with positive nitrites, large leukocyte esterase. Foley catheter was not able to be placed, he did produce 50 cc of urine for the UA, bladder scan showed only 200 cc in the bladder. Work up also revealed AKI.01/27/2013 patient is lethargic but arousable, and will answer questions. States having negative pain, but after answering questions will immediately fall back asleep. Daughter states that patient had been able to ambulate around SNF on on without help as of last week. Patient had been recently discharged on 01/07/2013 for similar problem of urinary tract infection/urine retention secondary to urethral stricture/prostate stricture. Patient had been discharged to SNF with Foley in place secondary to this problems, however daughter states approximately 2 weeks later physician (unknown name) ordered Foley removed. Daughter confirms that patient had lithotripsy performed on 01/14/2013 by Dr.Hailey Annabell Howells for left ureter stone. 01/28/2013 patient sitting up in bed much more alert obey some commands by daughters, will cooperate to some degree with exam 01/29/2013 patient asleep but arousable, can answer simple direct questions. Can participate in exam limited degree. TODAY patient is fully awake interactive with medical staff and his daughter watching television.  Answers questions appropriately.     Objective: Filed Vitals:   01/30/13 0457 01/30/13 0645 01/30/13 1148  01/30/13 1430  BP: 174/96 109/62  112/69  Pulse: 91 69  67  Temp: 97.1 F (36.2 C)   97.6 F (36.4 C)  TempSrc: Axillary   Axillary  Resp: 20   16  Height:      Weight:      SpO2: 98%  96% 99%    Intake/Output Summary (Last 24 hours) at 01/30/13 1741 Last data filed at 01/30/13 1504  Gross per 24 hour  Intake     40 ml  Output   1850 ml  Net  -1810 ml   Filed Weights   01/26/13 2254  Weight: 77.111 kg (170 lb)    Exam:   General: A./O. x1, answers questions concerning pain, greets you when you enter and leave the room. According to daughter has eaten his meals all day completely  Cardiovascular: Regular rate, negative murmurs rubs gallops, DP/PT pulse one plus bilateral   Respiratory: Clear to auscultation bilateral   Abdomen: Soft, nontender, nondistended, plus bowel sounds  Musculoskeletal: Negative pedal edema         Data Reviewed: Basic Metabolic Panel:  Recent Labs Lab 01/26/13 2027 01/27/13 0640 01/28/13 1510 01/29/13 0340 01/30/13 0400  NA 146* 148* 139 136 140  K 4.5 4.2 3.9 4.1 3.8  CL 111 115* 107 104 105  CO2 21 23 21  18* 23  GLUCOSE 155* 114* 128* 130* 108*  BUN 49* 40* 22 17 16   CREATININE 2.46* 2.18* 1.35 1.31 1.40*  CALCIUM 9.7 8.3* 8.5 8.9 9.2  MG  --   --  2.0 2.1 2.1   Liver Function Tests:  Recent Labs Lab 01/28/13 1510 01/29/13 0340 01/30/13 0400  AST 21 27 30   ALT 13 13 23   ALKPHOS 90 97 109  BILITOT 0.6 0.5 0.5  PROT 5.7* 6.2 6.3  ALBUMIN 3.0* 3.0* 3.2*   No results found for this basename: LIPASE, AMYLASE,  in the last 168 hours No results found for this basename: AMMONIA,  in the last 168 hours CBC:  Recent Labs Lab 01/26/13 2027 01/27/13 0640 01/28/13 1510 01/29/13 0340 01/30/13 0400  WBC 14.7* 12.1* 11.1* 13.2* 12.7*  NEUTROABS 11.1*  --  7.2 8.3*  --   HGB 15.2 12.9* 14.9 15.7 16.0  HCT  44.6 37.6* 42.6 45.1 45.7  MCV 98.2 98.7 96.6 97.6 97.0  PLT 223 191 170 185 180   Cardiac Enzymes: No results found for this basename: CKTOTAL, CKMB, CKMBINDEX, TROPONINI,  in the last 168 hours BNP (last 3 results) No results found for this basename: PROBNP,  in the last 8760 hours CBG: No results found for this basename: GLUCAP,  in the last 168 hours  Recent Results (from the past 240 hour(s))  URINE CULTURE     Status: None   Collection Time    01/26/13  8:27 PM      Result Value Range Status   Specimen Description URINE, RANDOM   Final   Special Requests NONE   Final   Culture  Setup Time     Final   Value: 01/27/2013 03:58     Performed at [facility] Count     Final   Value: NO GROWTH     Performed at [facility]   Culture     Final   Value: NO GROWTH     Performed at [facility]   Report Status 01/28/2013 FINAL   Final  CULTURE, BLOOD (ROUTINE X 2)  Status: None   Collection Time    01/26/13 10:30 PM      Result Value Range Status   Specimen Description BLOOD RIGHT FOREARM   Final   Special Requests BOTTLES DRAWN AEROBIC AND ANAEROBIC 5CC   Final   Culture  Setup Time     Final   Value: 01/27/2013 04:35     Performed at [facility]   Culture     Final   Value:        BLOOD CULTURE RECEIVED NO GROWTH TO DATE CULTURE WILL BE HELD FOR 5 DAYS BEFORE ISSUING A FINAL NEGATIVE REPORT     Performed at [facility]   Report Status PENDING   Incomplete  CULTURE, BLOOD (ROUTINE X 2)     Status: None   Collection Time    01/26/13 10:40 PM      Result Value Range Status   Specimen Description BLOOD RIGHT WRIST   Final   Special Requests BOTTLES DRAWN AEROBIC AND ANAEROBIC 5CC   Final   Culture  Setup Time     Final   Value: 01/27/2013 04:36     Performed at [facility]   Culture     Final   Value:        BLOOD CULTURE RECEIVED NO GROWTH TO DATE CULTURE WILL BE HELD FOR 5 DAYS BEFORE ISSUING A FINAL  NEGATIVE REPORT     Performed at [facility]   Report Status PENDING   Incomplete     Studies: No results found.  Scheduled Meds: . aspirin  81 mg Oral q morning - 10a  . atorvastatin  20 mg Oral QHS  . calcium carbonate  1 tablet Oral BID  . cefTRIAXone (ROCEPHIN)  IV  1 g Intravenous Q24H  . citalopram  15 mg Oral q morning - 10a  . mirtazapine  15 mg Oral QHS  . tamsulosin  0.4 mg Oral q morning - 10a  . tiotropium  18 mcg Inhalation q morning - 10a   Continuous Infusions: . dextrose 100 mL/hr at 01/29/13 1757  . dextrose 5 % and 0.45% NaCl 100 mL/hr at 01/30/13 1428    Principal Problem:   Urinary tract infection Active Problems:   Urethral stricture unspecified   Hypertension   Dementia   AKI (acute kidney injury)   Depression   HLD (hyperlipidemia)    Time spent: 35 minutes    Eyvonne Burchfield, J  [facility] Pager [phone]. If 7PM-7AM, please contact night-coverage at [email or web address], password [facility] 01/30/2013, 5:41 PM  LOS: 4 days

## 2013-01-31 ENCOUNTER — Inpatient Hospital Stay (HOSPITAL_COMMUNITY): Payer: Medicare HMO

## 2013-01-31 LAB — COMPREHENSIVE METABOLIC PANEL
ALT: 16 U/L (ref 0–53)
AST: 22 U/L (ref 0–37)
Albumin: 2.7 g/dL — ABNORMAL LOW (ref 3.5–5.2)
CO2: 25 mEq/L (ref 19–32)
Chloride: 107 mEq/L (ref 96–112)
Creatinine, Ser: 1.26 mg/dL (ref 0.50–1.35)
GFR calc non Af Amer: 50 mL/min — ABNORMAL LOW (ref >90)
Glucose, Bld: 130 mg/dL — ABNORMAL HIGH (ref 70–99)
Sodium: 141 mEq/L (ref 135–145)
Total Bilirubin: 0.3 mg/dL (ref 0.3–1.2)

## 2013-01-31 LAB — CBC
Hemoglobin: 13.2 g/dL (ref 13.0–17.0)
MCH: 33.8 pg (ref 26.0–34.0)
MCV: 96.9 fL (ref 78.0–100.0)
Platelets: 193 10*3/uL (ref 150–400)
RBC: 3.9 MIL/uL — ABNORMAL LOW (ref 4.22–5.81)
WBC: 10.6 10*3/uL — ABNORMAL HIGH (ref 4.0–10.5)

## 2013-01-31 MED ORDER — MORPHINE SULFATE 2 MG/ML IJ SOLN
INTRAMUSCULAR | Status: AC
  Filled 2013-01-31: qty 2

## 2013-01-31 MED ORDER — LIDOCAINE HCL 2 % EX GEL
CUTANEOUS | Status: AC
  Filled 2013-01-31: qty 10

## 2013-01-31 MED ORDER — MORPHINE SULFATE 2 MG/ML IJ SOLN
2.0000 mg | Freq: Once | INTRAMUSCULAR | Status: AC
  Administered 2013-01-31: 1 mg via INTRAVENOUS

## 2013-01-31 NOTE — Progress Notes (Signed)
Per MD order foley catheter was emptied and results at 1230 of new volume in bag was 50cc's.  Bladder scan was performed and results showed 649cc's of urine in bladder. MD was notified. New orders given. Will continue to monitor.

## 2013-01-31 NOTE — Progress Notes (Signed)
T/c from daughter stating that they would like follow up with [facility], as the facility's response to the SNF search was "Considering."  Weekday CSW to follow up with [facility] on Monday.  Providence Crosby, LCSWA Clinical Social Work [phone]

## 2013-01-31 NOTE — Progress Notes (Addendum)
Urology Progress Note  Subjective:     No acute urologic events overnight.  Renal function has trended up the last 2 days, but today is back down. Urine output is still good. Patient is disoriented and cannot answer appropriately. Bladder scan shows 500cc, but the patient has not recently voided.   ROS: Negative: chest pain or SOB.  Objective:  Patient Vitals for the past 24 hrs:  BP Temp Temp src Pulse Resp SpO2  01/31/13 0458 116/70 mmHg 97.3 F (36.3 C) Axillary 64 20 100 %  01/30/13 2015 110/60 mmHg 98.6 F (37 C) Oral 74 16 98 %  01/30/13 1430 112/69 mmHg 97.6 F (36.4 C) Axillary 67 16 99 %  01/30/13 1148 - - - - - 96 %    Physical Exam: General:  No acute distress, awake Cardiovascular:    [x]   S1/S2 present, RRR  []   Irregularly irregular Chest:  CTA-B Abdomen:               []  Soft, appropriately TTP  [x]  Soft, NTTP, bladder not palpable.  []  Soft, appropriately TTP, incision(s) clean/dry/intact  Genitourinary: Condom cath in place. Foley bag w/ tea colored urine. Negative clots.     I/O last 3 completed shifts: In: 1670 [P.O.:280; I.V.:1340; IV Piggyback:50] Out: 3275 [Urine:3275]  Recent Labs     01/30/13  0400  01/31/13  0541  HGB  16.0  13.2  WBC  12.7*  10.6*  PLT  180  193    Recent Labs     01/30/13  0400  01/31/13  0541  NA  140  141  K  3.8  3.4*  CL  105  107  CO2  23  25  BUN  16  13  CREATININE  1.40*  1.26  CALCIUM  9.2  8.6  GFRNONAA  44*  50*  GFRAA  51*  58*     No results found for this basename: PT, INR, APTT,  in the last 72 hours   No components found with this basename: ABG,     Length of stay: 5 days.  Assessment: Urethral stricture. UTI.    Plan: -I spoke w/ the patient's Daughter, Shawna Orleans, who does not want dilation/cath placement unless necessary.  -Renal US to check for hydro to rule out obstruction. -NPO except sips of meds until [place] complete in case a procedure is indicated. -Will follow.   Natalia Leatherwood, MD [phone]  ADDENDUM: I spoke w/ the radiologist who sees no sign of renal obstruction bilaterally. Given good urine output and renal function to date, will avoid manipulation for now. Will continue to follow. Patient's daughter notified. Will have nursing staff watch for urine output and if has not voided by noon, will reconsider need for catheter.

## 2013-01-31 NOTE — Procedures (Signed)
GU Procedure.  HPI: Patient has a known history of urethral stricture. His urinary retention. He has delirium and cannot follow instructions her commands. He has not been able to void since early this morning. His residuals greater than 600 cc. We have tried to avoid placing a Foley catheter, but at this point I feel this would be the best option for the patient. I discussed this with the patient's daughter. Using sterile technique I attempted to place an 68 French Foley catheter and a 14 French coud-tip catheter without success. I therefore felt it was appropriate to proceed with cystoscopy and Foley catheter placement with possible urethral dilation. I discussed this with the patient's daughter who is is the POA all the risks, benefits, and side effects. They deferred have this done the operating room.  Procedure:  After informed consent was obtained from the patient's daughter, I prepped and draped the patient's genitals and usual sterile technique. I then placed 10 cc of lidocaine jelly into the patient's urethra and he was given 1 mg of morphine intravenously. His arty taking antibiotics. I then passed a flexible cystoscope through the urethra and encountered a stricture in the prostatic urethra as previously described by Dr. Annabell Howells. I could not navigate the cystoscope beyond this area but he clearly seen to the bladder. I then loaded a sensor wire through the cystoscope and into the bladder. I removed the cystoscope and attempted placement of 16 [place] council latex catheter without success. I then created a council-tip catheter out of 14 French silicone catheter and was able to pass this with some manipulation through the strictured area. I inflated the balloon with 10 cc of sterile water and irrigated the catheter, but in the catheter slipped out indicating that the balloon had failed. I then made a council-tip catheter and a 16 French silicone catheter as this is more stiff and I was able to pass  this over a wire and into the bladder. I then inflated this with 10 cc of sterile water. I then proceeded to irrigate the Foley catheter with sterile water with return of small amount of clot. His urine then cleared to a light pink color.  The catheter was connected to closed drainage system.  This completed the procedure.  Plan: Try to avoid cholinergic medication for bladder spasms for now as this may worsen his delirium.  He may restart a renal diet.  Final disposition the Foley catheter will be determined by his urologist, Dr. Annabell Howells. I did note that the patient was able to void urine in his bladder became irritated by placement of the sensor wire into his bladder indicating that he does not have a significant obstruction. This may be a combination of the scar tissue and his delirium. If his delirium clears he may be able to pass a voiding trial in the future.

## 2013-01-31 NOTE — Progress Notes (Signed)
[facility] PROGRESS NOTE  Ruben Morrison ZOX:[id number] DOB: 1925/11/20 DOA: 01/26/2013 PCP: No primary provider on file.  Assessment/Plan:  UTI -Continue IV ceftriaxone (although would have been able to stop ABx in Am); will need to speak with urology in the A.m. to determine how much longer patient should be on antibiotics secondary to his procedure today -Secondary to patient's propensity for frequent bounced back for UTI would keep patient until his leukocytosis fully resolved.    AKI. -Continue to hydrate aggressively, continue D5W; Cr WNL  Hypernatremia -Continue  D5W at 172ml/hr  Urethral stricture -Have consulted [facility] for help with managing of patient's urinary symptoms (placement of Foley cath). -Per nursing 3 attempts at Foley catheterization have failed, most likely secondary to patient's previously known urethral/prostate stricture -Post void ultrasound showed 490 ml of retained urine -[facility]; S/P  CYSTOSCOPY [[id number] (Type: Custom)] C-INSERTION OF FOLEY CATHETERS [[id number]] 0n 01/31/2013 by Dr. Natalia Leatherwood   HTN -Patient within [facility] guidelines no HTN medication indicated   HLD -Continue Lipitor 20 mg daily  Depression -Continue patient's home regimen of Celexa/Remeron  Acute/chronic Dementia -Clearing patient able to communicate, follow all commands (baseline?) -Urine and blood cultures pending; see below for results    Code Status: Full Family Communication: Daughter present for discussion of plan of care Disposition Plan: Per urology   Consultants: Urology (Dr. Clayton Bibles)    Procedures: CYSTOSCOPY [[id number] (Type: Custom)] C-INSERTION OF FOLEY CATHETERS [[id number]] 0n 01/31/2013 by Dr. Natalia Leatherwood    Renal/urinary tract ultrasound 01/26/2013 No hydronephrosis in the kidneys. Stones identified at prior CT are  not seen sonographically. There is diffuse wall thickening of the  bladder with layering debris  and color flow demonstrated. Changes  could represent inflammatory change due to cystitis or transitional  cell neoplasm.  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 Patient presents with frank hematuria. Family notes patient more lethargic than baseline as well.  Work up in the ED was suggestive of UTI with positive nitrites, large leukocyte esterase. Foley catheter was not able to be placed, he did produce 50 cc of urine for the UA, bladder scan showed only 200 cc in the bladder. Work up also revealed AKI.01/27/2013 patient is lethargic but arousable, and will answer questions. States having negative pain, but after answering questions will immediately fall back asleep. Daughter states that patient had been able to ambulate around SNF on on without help as of last week. Patient had been recently discharged on 01/07/2013 for similar problem of urinary tract infection/urine retention secondary to urethral stricture/prostate stricture. Patient had been discharged to SNF with Foley in place secondary to this problems, however daughter states approximately 2 weeks later physician (unknown name) ordered Foley removed. Daughter confirms that patient had lithotripsy performed on 01/14/2013 by Dr.Jaymari Annabell Howells for left ureter stone. 01/28/2013 patient sitting up in bed much more alert obey some commands by daughters, will cooperate to some degree with exam 01/29/2013 patient asleep but arousable, can answer simple direct questions. Can participate in exam limited degree. 01/30/2013 patient is fully awake interactive with medical staff  and his daughter watching television. Answers questions appropriately. TODAY patient awake, resting  comfortably watching television S/P CYSTOSCOPY [[id number] (Type: Custom)] C-INSERTION OF FOLEY CATHETERS [[id number]] . Creatinine upon entry to the room and was able to answer questions concerning pain comfort. Was able to assist in his exam.     Objective: Filed Vitals:   01/30/13 1430 01/30/13 2015 01/31/13 0458 01/31/13 1525  BP: 112/69 110/60 116/70 118/60  Pulse: 67 74 64 73  Temp: 97.6 F (36.4 C) 98.6 F (37 C) 97.3 F (36.3 C) 97.2 F (36.2 C)  TempSrc: Axillary Oral Axillary Oral  Resp: 16 16 20 20   Height:      Weight:      SpO2: 99% 98% 100% 98%    Intake/Output Summary (Last 24 hours) at 01/31/13 2043 Last data filed at 01/31/13 1828  Gross per 24 hour  Intake   1870 ml  Output   1150 ml  Net    720 ml   Filed Weights   01/26/13 2254  Weight: 77.111 kg (170 lb)    Exam:   General: A./O. x1, answers questions concerning pain, greets you when you enter and leave the room. Patient stated ate well today, although could not tell me exactly what he had eaten  Cardiovascular: Regular rate, negative murmurs rubs gallops, DP/PT pulse one plus bilateral   Respiratory: Clear to auscultation bilateral   Abdomen: Soft, nontender, nondistended, plus bowel sounds  Genitourinary; patient has new Foley cath placed approximately 100 mils of extremely dark bloody urine in bag  Musculoskeletal: Negative pedal edema         Data Reviewed: Basic Metabolic Panel:  Recent Labs Lab 01/27/13 0640 01/28/13 1510 01/29/13 0340 01/30/13 0400 01/31/13 0541  NA 148* 139 136 140 141  K 4.2 3.9 4.1 3.8 3.4*  CL 115* 107 104 105 107  CO2 23 21 18* 23 25  GLUCOSE 114* 128* 130* 108* 130*  BUN 40* 22 17 16 13   CREATININE 2.18* 1.35 1.31 1.40* 1.26  CALCIUM 8.3* 8.5 8.9 9.2 8.6  MG  --  2.0 2.1 2.1 1.9   Liver Function Tests:  Recent Labs Lab 01/28/13 1510  01/29/13 0340 01/30/13 0400 01/31/13 0541  AST 21 27 30 22   ALT 13 13 23 16   ALKPHOS 90 97 109 89  BILITOT 0.6 0.5 0.5 0.3  PROT 5.7* 6.2 6.3 5.3*  ALBUMIN 3.0* 3.0* 3.2* 2.7*   No results found for this basename: LIPASE, AMYLASE,  in the last 168 hours No results found for this basename: AMMONIA,  in the last 168 hours CBC:  Recent Labs Lab 01/26/13 2027 01/27/13 0640 01/28/13 1510 01/29/13 0340 01/30/13 0400 01/31/13 0541  WBC 14.7* 12.1* 11.1* 13.2* 12.7* 10.6*  NEUTROABS 11.1*  --  7.2 8.3*  --   --   HGB 15.2 12.9* 14.9 15.7 16.0 13.2  HCT 44.6 37.6* 42.6 45.1 45.7 37.8*  MCV 98.2 98.7 96.6 97.6 97.0 96.9  PLT 223 191 170 185 180 193   Cardiac Enzymes: No results found for this basename: CKTOTAL, CKMB, CKMBINDEX, TROPONINI,  in the last 168 hours BNP (last 3 results) No results found for this basename: PROBNP,  in the last 8760 hours CBG: No results found for this basename: GLUCAP,  in the last 168 hours  Recent Results (from the past 240 hour(s))  URINE CULTURE     Status: None   Collection Time    01/26/13  8:27 PM      Result Value Range Status   Specimen Description URINE, RANDOM   Final  Special Requests NONE   Final   Culture  Setup Time     Final   Value: 01/27/2013 03:58     Performed at [facility] Count     Final   Value: NO GROWTH     Performed at [facility]   Culture     Final   Value: NO GROWTH     Performed at [facility]   Report Status 01/28/2013 FINAL   Final  CULTURE, BLOOD (ROUTINE X 2)     Status: None   Collection Time    01/26/13 10:30 PM      Result Value Range Status   Specimen Description BLOOD RIGHT FOREARM   Final   Special Requests BOTTLES DRAWN AEROBIC AND ANAEROBIC 5CC   Final   Culture  Setup Time     Final   Value: 01/27/2013 04:35     Performed at [facility]   Culture     Final   Value:        BLOOD CULTURE RECEIVED NO GROWTH TO DATE CULTURE WILL BE HELD FOR 5 DAYS  BEFORE ISSUING A FINAL NEGATIVE REPORT     Performed at [facility]   Report Status PENDING   Incomplete  CULTURE, BLOOD (ROUTINE X 2)     Status: None   Collection Time    01/26/13 10:40 PM      Result Value Range Status   Specimen Description BLOOD RIGHT WRIST   Final   Special Requests BOTTLES DRAWN AEROBIC AND ANAEROBIC 5CC   Final   Culture  Setup Time     Final   Value: 01/27/2013 04:36     Performed at [facility]   Culture     Final   Value:        BLOOD CULTURE RECEIVED NO GROWTH TO DATE CULTURE WILL BE HELD FOR 5 DAYS BEFORE ISSUING A FINAL NEGATIVE REPORT     Performed at [facility]   Report Status PENDING   Incomplete     Studies: US Renal  01/31/2013   CLINICAL DATA:  Evaluate for hydronephrosis. Recent shock wave lithotripsy on the left. Possible UTI with mental status changes.  EXAM: RENAL/URINARY TRACT ULTRASOUND COMPLETE  COMPARISON:  Renal ultrasound 01/26/2013 and CT abdomen pelvis 01/03/2013  FINDINGS: Right Kidney:  Length: 10.1 cm. There is slight diffuse cortical thinning of the right kidney. Echogenicity of the cortex is slightly increased. Approximately two tiny echogenic areas are seen within the right renal collecting system, likely reflecting nonobstructing stones. No mass or hydronephrosis visualized.  Left Kidney:  Length: Measures 9.7 cm in sagittal length. There is slight thinning of the renal cortex. The cortex appears within normal limits for echogenicity. There is a stable upper pole central sinus cyst. Negative for hydronephrosis. 6 x 1 mm echogenic area within the midpole of left kidney may reflect a small stone fragment.  There is no evidence of ureteral dilatation.  Bladder:  The urinary bladder is moderately distended at the time imaging. There is a moderate amount of echogenic debris layering dependently within the bladder. Some of the echogenic areas are somewhat rounded and could be stone fragments or blood clots.   IMPRESSION: 1. Negative for hydronephrosis. 2. Moderate amount of debris layering within the urinary bladder. Some of the echogenic debris within the bladder somewhat rounded in could reflect stone fragments given history of recent lithotripsy, or blood clots. 3. Renal sinus cysts left  kidney. 4. Suspect 1 or 2 small nonobstructing stones in both kidneys.   Electronically Signed   By: Britta Mccreedy M.D.   On: 01/31/2013 09:49    Scheduled Meds: . aspirin  81 mg Oral q morning - 10a  . atorvastatin  20 mg Oral QHS  . calcium carbonate  1 tablet Oral BID  . cefTRIAXone (ROCEPHIN)  IV  1 g Intravenous Q24H  . citalopram  15 mg Oral q morning - 10a  . mirtazapine  15 mg Oral QHS  . morphine      . tamsulosin  0.4 mg Oral q morning - 10a  . tiotropium  18 mcg Inhalation q morning - 10a   Continuous Infusions: . dextrose 100 mL/hr at 01/29/13 1757  . dextrose 5 % and 0.45% NaCl 100 mL/hr at 01/31/13 1610    Principal Problem:   Urinary tract infection Active Problems:   Urethral stricture unspecified   Hypertension   Dementia   AKI (acute kidney injury)   Depression   HLD (hyperlipidemia)    Time spent: 35 minutes    Sabrine Patchen, J  [facility] Pager [phone]. If 7PM-7AM, please contact night-coverage at [email or web address], password [facility] 01/31/2013, 8:43 PM  LOS: 5 days

## 2013-01-31 NOTE — Progress Notes (Signed)
Notified Pt's daughter of bed offers received via phone.  Reviewed facilities with daughter and answered questions.  Pt's daughter to research facilities on [email or web address], as she's visited 2 but wants more detailed rating information.  CSW to leave offers in Pt's room.  CSW thanked Pt's daughter for her time.  Providence Crosby, LCSWA Clinical Social Work [phone]

## 2013-02-01 LAB — CBC
HCT: 37.2 % — ABNORMAL LOW (ref 39.0–52.0)
Platelets: 180 10*3/uL (ref 150–400)
RBC: 3.88 MIL/uL — ABNORMAL LOW (ref 4.22–5.81)
WBC: 13 10*3/uL — ABNORMAL HIGH (ref 4.0–10.5)

## 2013-02-01 LAB — MAGNESIUM: Magnesium: 1.8 mg/dL (ref 1.5–2.5)

## 2013-02-01 LAB — COMPREHENSIVE METABOLIC PANEL
ALT: 15 U/L (ref 0–53)
BUN: 9 mg/dL (ref 6–23)
CO2: 20 mEq/L (ref 19–32)
Chloride: 108 mEq/L (ref 96–112)
Creatinine, Ser: 1.13 mg/dL (ref 0.50–1.35)
GFR calc non Af Amer: 57 mL/min — ABNORMAL LOW (ref >90)
Total Bilirubin: 0.3 mg/dL (ref 0.3–1.2)

## 2013-02-01 NOTE — Progress Notes (Signed)
Occupational Therapy Treatment Patient Details Name: Ruben Morrison MRN: [id number] DOB: February 06, 1926 Today's Date: 02/01/2013 Time: 4098-1191 OT Time Calculation (min): 15 min  OT Assessment / Plan / Recommendation  History of present illness Ruben Morrison is a 79 y.o. 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  Patient presents with frank hematuria.        Follow Up Recommendations  SNF;Supervision/Assistance - 24 hour       Equipment Recommendations  None recommended by OT       Frequency Min 2X/week   Progress towards OT Goals Progress towards OT goals: Progressing toward goals  Plan Discharge plan remains appropriate    Precautions / Restrictions Precautions Precautions: Fall Precaution Comments: dementia Restrictions Weight Bearing Restrictions: No       ADL  Toilet Transfer Method: Sit to stand;Other (comment) (from chair x 4 focusing on hand placement) Transfers/Ambulation Related to ADLs: Pt awake during OT session and did well following directions.  Pt practiced sit to stand focusing on pushing up with BUE and reaching back for chair      OT Goals(current goals can now be found in the care plan section) Acute Rehab OT Goals Patient Stated Goal: not able to state  Visit Information  Last OT Received On: 02/01/13 Assistance Needed: +2 History of Present Illness: Ruben Morrison is a 79 y.o. 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  Patient presents with frank hematuria.  Family notes patient more lethargic than baseline as well.          Cognition  Cognition Arousal/Alertness: Awake/alert Behavior During Therapy: WFL for tasks assessed/performed Overall Cognitive Status: No family/caregiver  present to determine baseline cognitive functioning    Mobility  Bed Mobility Bed Mobility: Not assessed Supine to Sit: 1: +2 Total assist Supine to Sit: Patient Percentage: 20% Details for Bed Mobility Assistance: use  of bed pad to scoot hips around to EOB, assist for LEs off the bed Transfers Transfers: Sit to Stand;Stand to Sit Sit to Stand: 3: Mod assist;With armrests;From chair/3-in-1 Sit to Stand: Patient Percentage: 40% Stand to Sit: 3: Mod assist;With armrests;To chair/3-in-1 Stand to Sit: Patient Percentage: 10% Details for Transfer Assistance: assist to rise and steady        Balance Balance Balance Assessed: Yes Static Sitting Balance Static Sitting - Balance Support: Bilateral upper extremity supported Static Sitting - Level of Assistance: 5: Stand by assistance Static Sitting - Comment/# of Minutes: 2   End of Session OT - End of Session Activity Tolerance: Patient tolerated treatment well Patient left: in chair;with call bell/phone within reach  GO     Merland Holness, [place] Kung 02/01/2013, 2:26 PM

## 2013-02-01 NOTE — Progress Notes (Signed)
CSW continuing to follow for disposition planning to SNF.  CSW noted that per weekend CSW, pt family interested in [facility] and facility had responded considering.   CSW spoke to [facility] and send facility updated clinicals.   CSW received notification from [facility] that facility able to offer pt a bed.  CSW visited pt room and pt resting comfortably at this time and no family present at bedside.  CSW contacted pt daughter, Shawna Orleans via telephone to discuss.  CSW notified pt daughter that [facility] was able to offer a bed.   Pt daughter accepting of bed offer at [facility].  CSW notified facility and facility plans to submit [facility] authorization.  Per charge RN, MD anticipates discharge in next 1-2 days.  CSW to continue to follow and facilitate pt discharge needs when pt medically ready for discharge and [facility] authorization received.  Jacklynn Lewis, MSW, LCSWA  Clinical Social Work [phone]

## 2013-02-01 NOTE — Progress Notes (Addendum)
[facility] PROGRESS NOTE  Ruben Morrison NUU:[id number] DOB: 1925/10/10 DOA: 01/26/2013 PCP: No primary provider on file.  Assessment/Plan:  UTI -Continue IV ceftriaxone, noted white blood cell count mildly elevated today. Will discuss with urology as for length of antibiotics -Secondary to patient's propensity for frequent bounced back for UTI would keep patient until his leukocytosis fully resolved.    AKI. Acute renal failure in the setting of postobstructive uropathy. Responding well and creatinine down to 1.13 today  Hypernatremia -Continue  D5W at 121ml/hr, sodium down to 139  Urethral stricture -Have consulted [facility] for help with managing of patient's urinary symptoms (placement of Foley cath). -Per nursing 3 attempts at Foley catheterization have failed, most likely secondary to patient's previously known urethral/prostate stricture -Post void ultrasound showed 490 ml of retained urine -[facility]; S/P  CYSTOSCOPY [HKV425 (Type: Custom)] C-INSERTION OF FOLEY CATHETERS [[id number]] 0n 01/31/2013 by Dr. Natalia Leatherwood   HTN -Patient within [facility] guidelines no HTN medication indicated   HLD -Continue Lipitor 20 mg daily  Depression -Continue patient's home regimen of Celexa/Remeron  Acute/chronic Dementia -Clearing patient able to communicate, follow all commands (baseline?) -Urine and blood cultures pending; see below for results    Code Status: Full Family Communication: Spoke with daughter by phone  Disposition Plan: Discharge in the next one to 2 days as well blood cell count results.   Consultants: Urology (Dr. Clayton Bibles)    Procedures: CYSTOSCOPY [PPI951 (Type: Custom)] C-INSERTION OF FOLEY CATHETERS [[id number]] 0n 01/31/2013 by Dr. Natalia Leatherwood    Renal/urinary tract ultrasound 01/26/2013 No hydronephrosis in the kidneys. Stones identified at prior CT are  not seen sonographically. There is diffuse wall thickening of  the  bladder with layering debris and color flow demonstrated. Changes  could represent inflammatory change due to cystitis or transitional  cell neoplasm.  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 Patient presents with frank hematuria. Family notes patient more lethargic than baseline as well.  Work up in the ED was suggestive of UTI with positive nitrites, large leukocyte esterase. Foley catheter was not able to be placed, he did produce 50 cc of urine for the UA, bladder scan showed only 200 cc in the bladder. Work up also revealed AKI.01/27/2013 patient is lethargic but arousable, and will answer questions. States having negative pain, but after answering questions will immediately fall back asleep. Daughter states that patient had been able to ambulate around SNF on on without help as of last week. Patient had been recently discharged on 01/07/2013 for similar problem of urinary tract infection/urine retention secondary to urethral stricture/prostate stricture. Patient had been discharged to SNF with Foley in place secondary to this problems, however daughter states approximately 2 weeks later physician (unknown name) ordered Foley removed. Daughter confirms that patient had lithotripsy performed on 01/14/2013 by Dr.Jarmarcus Annabell Howells for left ureter stone. 01/28/2013 patient sitting up in bed much more alert obey some commands by daughters, will cooperate to some degree with exam 01/29/2013 patient asleep but arousable, can answer simple direct questions. Can participate in exam limited degree. 01/30/2013 patient is fully  awake interactive with medical staff and his daughter watching television. Answers  questions appropriately. 11/30: Patient underwent cystoscopy and tolerated well     Objective: Filed Vitals:   01/31/13 1525 01/31/13 2131 02/01/13 0458 02/01/13 0941  BP: 118/60 110/68 109/70   Pulse: 73 83 66   Temp: 97.2 F (36.2 C) 98.8 F (37.1 C) 98.5 F (36.9 C)   TempSrc: Oral Oral Oral   Resp: 20 20 16    Height:      Weight:      SpO2: 98% 99% 100% 98%    Intake/Output Summary (Last 24 hours) at 02/01/13 1238 Last data filed at 02/01/13 0459  Gross per 24 hour  Intake   3545 ml  Output    850 ml  Net   2695 ml   Filed Weights   01/26/13 2254  Weight: 77.111 kg (170 lb)    Exam:   General: A./O. x1, no complaints. Denies any pain. No requests.  Cardiovascular: Regular rate, negative murmurs rubs gallops, DP/PT pulse one plus bilateral   Respiratory: Clear to auscultation bilateral   Abdomen: Soft, nontender, nondistended, plus bowel sounds  Genitourinary; patient has new Foley cath placed   Musculoskeletal: Negative pedal edema         Data Reviewed: Basic Metabolic Panel:  Recent Labs Lab 01/28/13 1510 01/29/13 0340 01/30/13 0400 01/31/13 0541 02/01/13 0546  NA 139 136 140 141 139  K 3.9 4.1 3.8 3.4* 3.8  CL 107 104 105 107 108  CO2 21 18* 23 25 20   GLUCOSE 128* 130* 108* 130* 117*  BUN 22 17 16 13 9   CREATININE 1.35 1.31 1.40* 1.26 1.13  CALCIUM 8.5 8.9 9.2 8.6 8.4  MG 2.0 2.1 2.1 1.9 1.8   Liver Function Tests:  Recent Labs Lab 01/28/13 1510 01/29/13 0340 01/30/13 0400 01/31/13 0541 02/01/13 0546  AST 21 27 30 22 18   ALT 13 13 23 16 15   ALKPHOS 90 97 109 89 87  BILITOT 0.6 0.5 0.5 0.3 0.3  PROT 5.7* 6.2 6.3 5.3* 5.2*  ALBUMIN 3.0* 3.0* 3.2* 2.7* 2.5*   No results found for this basename: LIPASE, AMYLASE,  in the last 168 hours No results found for this basename: AMMONIA,  in the last 168 hours CBC:  Recent Labs Lab 01/26/13 2027   01/28/13 1510 01/29/13 0340 01/30/13 0400 01/31/13 0541 02/01/13 0546  WBC 14.7*  < > 11.1* 13.2* 12.7* 10.6* 13.0*  NEUTROABS 11.1*  --  7.2 8.3*  --   --   --   HGB 15.2  < > 14.9 15.7 16.0 13.2 13.1  HCT 44.6  < > 42.6 45.1 45.7 37.8* 37.2*  MCV 98.2  < > 96.6 97.6 97.0 96.9 95.9  PLT 223  < > 170 185 180 193 180  < > = values in this interval not displayed. Cardiac Enzymes: No results found for this basename: CKTOTAL, CKMB, CKMBINDEX, TROPONINI,  in the last 168 hours BNP (last 3 results) No results found for this basename: PROBNP,  in the last 8760 hours CBG: No results found for this basename: GLUCAP,  in the last 168 hours  Recent Results (from the past 240 hour(s))  URINE CULTURE     Status: None   Collection Time    01/26/13  8:27 PM      Result Value Range Status   Specimen Description URINE, RANDOM   Final   Special Requests NONE   Final   Culture  Setup Time     Final   Value: 01/27/2013 03:58  Performed at [facility] Count     Final   Value: NO GROWTH     Performed at [facility]   Culture     Final   Value: NO GROWTH     Performed at [facility]   Report Status 01/28/2013 FINAL   Final  CULTURE, BLOOD (ROUTINE X 2)     Status: None   Collection Time    01/26/13 10:30 PM      Result Value Range Status   Specimen Description BLOOD RIGHT FOREARM   Final   Special Requests BOTTLES DRAWN AEROBIC AND ANAEROBIC 5CC   Final   Culture  Setup Time     Final   Value: 01/27/2013 04:35     Performed at [facility]   Culture     Final   Value:        BLOOD CULTURE RECEIVED NO GROWTH TO DATE CULTURE WILL BE HELD FOR 5 DAYS BEFORE ISSUING A FINAL NEGATIVE REPORT     Performed at [facility]   Report Status PENDING   Incomplete  CULTURE, BLOOD (ROUTINE X 2)     Status: None   Collection Time    01/26/13 10:40 PM      Result Value Range Status   Specimen Description BLOOD RIGHT WRIST   Final   Special  Requests BOTTLES DRAWN AEROBIC AND ANAEROBIC 5CC   Final   Culture  Setup Time     Final   Value: 01/27/2013 04:36     Performed at [facility]   Culture     Final   Value:        BLOOD CULTURE RECEIVED NO GROWTH TO DATE CULTURE WILL BE HELD FOR 5 DAYS BEFORE ISSUING A FINAL NEGATIVE REPORT     Performed at [facility]   Report Status PENDING   Incomplete     Studies: US Renal  01/31/2013   CLINICAL DATA:  Evaluate for hydronephrosis. Recent shock wave lithotripsy on the left. Possible UTI with mental status changes.  EXAM: RENAL/URINARY TRACT ULTRASOUND COMPLETE  COMPARISON:  Renal ultrasound 01/26/2013 and CT abdomen pelvis 01/03/2013  FINDINGS: Right Kidney:  Length: 10.1 cm. There is slight diffuse cortical thinning of the right kidney. Echogenicity of the cortex is slightly increased. Approximately two tiny echogenic areas are seen within the right renal collecting system, likely reflecting nonobstructing stones. No mass or hydronephrosis visualized.  Left Kidney:  Length: Measures 9.7 cm in sagittal length. There is slight thinning of the renal cortex. The cortex appears within normal limits for echogenicity. There is a stable upper pole central sinus cyst. Negative for hydronephrosis. 6 x 1 mm echogenic area within the midpole of left kidney may reflect a small stone fragment.  There is no evidence of ureteral dilatation.  Bladder:  The urinary bladder is moderately distended at the time imaging. There is a moderate amount of echogenic debris layering dependently within the bladder. Some of the echogenic areas are somewhat rounded and could be stone fragments or blood clots.  IMPRESSION: 1. Negative for hydronephrosis. 2. Moderate amount of debris layering within the urinary bladder. Some of the echogenic debris within the bladder somewhat rounded in could reflect stone fragments given history of recent lithotripsy, or blood clots. 3. Renal sinus cysts left kidney. 4. Suspect 1  or 2 small nonobstructing stones in both kidneys.   Electronically Signed   By: Britta Mccreedy M.D.   On:  01/31/2013 09:49    Scheduled Meds: . aspirin  81 mg Oral q morning - 10a  . atorvastatin  20 mg Oral QHS  . calcium carbonate  1 tablet Oral BID  . cefTRIAXone (ROCEPHIN)  IV  1 g Intravenous Q24H  . citalopram  15 mg Oral q morning - 10a  . mirtazapine  15 mg Oral QHS  . tamsulosin  0.4 mg Oral q morning - 10a  . tiotropium  18 mcg Inhalation q morning - 10a   Continuous Infusions: . dextrose 100 mL/hr at 01/29/13 1757  . dextrose 5 % and 0.45% NaCl 100 mL/hr at 01/31/13 1610    Principal Problem:   Urinary tract infection Active Problems:   Urethral stricture unspecified   Hypertension   Dementia   AKI (acute kidney injury)   Depression   HLD (hyperlipidemia)    Time spent: 20 minutes    Hollice Espy  [facility] Pager [phone]. If 7PM-7AM, please contact night-coverage at [email or web address], password [facility] 02/01/2013, 12:38 PM  LOS: 6 days

## 2013-02-01 NOTE — Progress Notes (Signed)
Physical Therapy Treatment Patient Details Name: Tell Rozelle MRN: [id number] DOB: [birth year] Today's Date: 02/01/2013 Time: 4098-1191 PT Time Calculation (min): 8 min  PT Assessment / Plan / Recommendation  History of Present Illness Robson Trickey is a 79 y.o. 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  Patient presents with frank hematuria.  Family notes patient more lethargic than baseline as well.   PT Comments   *Pt minimally responsive to questions and to instructions. Walked 5' today with RW and assist of 2.  **  Follow Up Recommendations  SNF     Does the patient have the potential to tolerate intense rehabilitation     Barriers to Discharge        Equipment Recommendations  None recommended by PT    Recommendations for Other Services OT consult  Frequency Min 3X/week   Progress towards PT Goals Progress towards PT goals: Progressing toward goals  Plan Current plan remains appropriate    Precautions / Restrictions Precautions Precautions: Fall Precaution Comments: dementia Restrictions Weight Bearing Restrictions: No   Pertinent Vitals/Pain **pt denied pain*    Mobility  Bed Mobility Bed Mobility: Supine to Sit;Sit to Supine Supine to Sit: 1: +2 Total assist Supine to Sit: Patient Percentage: 20% Details for Bed Mobility Assistance: use  of bed pad to scoot hips around to EOB, assist for LEs off the bed Transfers Transfers: Sit to Stand;Stand to Sit Sit to Stand: 1: +2 Total assist Sit to Stand: Patient Percentage: 40% Stand to Sit: 1: +2 Total assist Stand to Sit: Patient Percentage: 10% Details for Transfer Assistance: assist to rise and steady  Ambulation/Gait Ambulation/Gait Assistance: 1: +2 Total assist Ambulation/Gait: Patient Percentage: 50% Ambulation Distance (Feet): 5 Feet Assistive device: Rolling  walker Gait Pattern: Decreased step length - right;Decreased step length - left;Trunk flexed;Narrow base of support Gait velocity: decreased General Gait Details: VCs to increase step length, to widen BOS, pt did not respond to these instructions    Exercises     PT Diagnosis:    PT Problem List:   PT Treatment Interventions:     PT Goals (current goals can now be found in the care plan section) Acute Rehab PT Goals Patient Stated Goal: not able to state PT Goal Formulation: Patient unable to participate in goal setting Time For Goal Achievement: 02/12/13 Potential to Achieve Goals: Fair  Visit Information  Last PT Received On: 02/01/13 Assistance Needed: +2 History of Present Illness: Oluwatobi Visser is a 79 y.o. 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  Patient presents with frank hematuria.  Family notes patient more lethargic than baseline as well.    Subjective Data  Patient Stated Goal: not able to state   Cognition  Cognition Arousal/Alertness: Awake/alert Behavior During Therapy: WFL for tasks assessed/performed Overall Cognitive Status: No family/caregiver present to determine baseline cognitive functioning    Balance  Balance Balance Assessed: Yes Static Sitting Balance Static Sitting - Balance Support: Bilateral upper extremity supported Static Sitting - Level of Assistance: 5: Stand by assistance Static Sitting - Comment/# of Minutes: 2  End of Session PT - End of Session Equipment Utilized During Treatment: Gait belt Activity Tolerance: Patient limited by fatigue Patient left: with call bell/phone within reach;in chair Nurse Communication:  Mobility status   GP     Ralene Bathe Kistler 02/01/2013, 12:07 PM [phone]

## 2013-02-02 ENCOUNTER — Inpatient Hospital Stay (HOSPITAL_COMMUNITY): Payer: Medicare HMO

## 2013-02-02 LAB — COMPREHENSIVE METABOLIC PANEL
AST: 33 U/L (ref 0–37)
Albumin: 2.5 g/dL — ABNORMAL LOW (ref 3.5–5.2)
Alkaline Phosphatase: 97 U/L (ref 39–117)
CO2: 25 mEq/L (ref 19–32)
Chloride: 106 mEq/L (ref 96–112)
Creatinine, Ser: 1.39 mg/dL — ABNORMAL HIGH (ref 0.50–1.35)
Glucose, Bld: 118 mg/dL — ABNORMAL HIGH (ref 70–99)
Potassium: 3.5 mEq/L (ref 3.5–5.1)
Total Bilirubin: 0.3 mg/dL (ref 0.3–1.2)

## 2013-02-02 LAB — CULTURE, BLOOD (ROUTINE X 2): Culture: NO GROWTH

## 2013-02-02 LAB — CBC
MCH: 32.8 pg (ref 26.0–34.0)
MCHC: 33.8 g/dL (ref 30.0–36.0)
MCV: 97 fL (ref 78.0–100.0)
Platelets: 190 10*3/uL (ref 150–400)
RBC: 3.69 MIL/uL — ABNORMAL LOW (ref 4.22–5.81)

## 2013-02-02 LAB — MAGNESIUM: Magnesium: 1.9 mg/dL (ref 1.5–2.5)

## 2013-02-02 MED ORDER — CEFUROXIME AXETIL 500 MG PO TABS
500.0000 mg | ORAL_TABLET | Freq: Two times a day (BID) | ORAL | Status: DC
  Administered 2013-02-02 – 2013-02-03 (×2): 500 mg via ORAL
  Filled 2013-02-02 (×4): qty 1

## 2013-02-02 NOTE — Progress Notes (Signed)
Patient ID: Ruben Morrison, male   DOB: 07-May-1925, 79 y.o.   MRN: [id number]  Ruben Morrison is doing well with the foley indwelling at this time.   His last KUB on 11/21 showed residual fragments in the proximal ureter but they did appear changed from prior to the ESWL.   His renal US's on 11/25 and 11/30 showed no obvious stones or hydro and there was some debris in the bladder.   He has been admitted with probable sepsis but he has had 3 negative urine cultures and the nit + urine on this admission may have been a false positive from the hematuria.     I had a long talk with his daughter regarding his condition and radiological and lab findings.  At this point I am going to order a KUB to better assess for the presence of residual fragments.  If he is to be discharged tomorrow, he will need to go home with the foley and should f/u in my office in 2-3 weeks for a voiding trial.   He could stay on the Trimethoprim for suppression for now.   I will be back in the morning to review the KUB.

## 2013-02-02 NOTE — Progress Notes (Signed)
[facility] PROGRESS NOTE  Ruben Morrison YNW:[id number] DOB: 1925-09-14 DOA: 01/26/2013 PCP: No primary provider on file.  Assessment/Plan:  UTI -Discussed with urology and change to Ceftin 500 by mouth twice a day, given Cipro allergy -  AKI. Renal function mild elevation today,. Neurology felt this was not significant. Repeat labs in the morning  Hypernatremia -Resolved. Discontinue IV fluids today.  Urethral stricture -Have consulted [facility] for help with managing of patient's urinary symptoms (placement of Foley cath). -Per nursing 3 attempts at Foley catheterization have failed, most likely secondary to patient's previously known urethral/prostate stricture -Post void ultrasound showed 490 ml of retained urine -[facility]; S/P  CYSTOSCOPY [MVH846 (Type: Custom)] C-INSERTION OF FOLEY CATHETERS [[id number]] 0n 01/31/2013 by Dr. Natalia Leatherwood  Plan will be for patient to continue Foley catheter and follow up with urology as outpatient. Continue 5 more days of by mouth antibiotics   HTN -Patient within AHA guidelines no HTN medication indicated   HLD -Continue Lipitor 20 mg daily  Depression -Continue patient's home regimen of Celexa/Remeron  Acute/chronic Dementia -Clearing patient able to communicate, follow all commands     Code Status: Full Family Communication: Left messages patient's daughter by phone  Disposition Plan: Skilled nursing facility tomorrow   Consultants: Urology (Dr. Clayton Bibles)    Procedures: CYSTOSCOPY [LKG401 (Type: Custom)] C-INSERTION OF FOLEY CATHETERS [[id number]] 0n 01/31/2013 by Dr. Natalia Leatherwood    Renal/urinary tract ultrasound 01/26/2013 No hydronephrosis in the kidneys. Stones identified at prior CT are  not seen sonographically. There is diffuse wall thickening of the  bladder with layering debris and color flow demonstrated. Changes  could represent inflammatory change due to cystitis or  transitional  cell neoplasm.  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 Patient presents with frank hematuria. Family notes patient more lethargic than baseline as well.  Work up in the ED was suggestive of UTI with positive nitrites, large leukocyte esterase. Foley catheter was not able to be placed, he did produce 50 cc of urine for the UA, bladder scan showed only 200 cc in the bladder. Work up also revealed AKI.01/27/2013 patient is lethargic but arousable, and will answer questions. States having negative pain, but after answering questions will immediately fall back asleep. Daughter states that patient had been able to ambulate around SNF on on without help as of last week. Patient had been recently discharged on 01/07/2013 for similar problem of urinary tract infection/urine retention secondary to urethral stricture/prostate stricture. Patient had been discharged to SNF with Foley in place secondary to this problems, however daughter states approximately 2 weeks later physician (unknown name) ordered Foley removed. Daughter confirms that patient had lithotripsy performed on 01/14/2013 by Dr.Carnel Annabell Howells for left ureter stone. 01/28/2013 patient sitting up in bed much more alert obey some commands by daughters, will cooperate to some degree with exam 01/29/2013 patient asleep but arousable, can answer simple direct questions. Can participate in exam limited degree. 01/30/2013 patient is fully awake interactive with medical staff and his daughter watching television. Answers questions appropriately.  11/30: Patient underwent cystoscopy and tolerated well 12/2 Patient currently doing well. No complaints, for catheter  draining     Objective: Filed Vitals:   02/01/13 0941 02/01/13 1355 02/01/13 2118 02/02/13 0558  BP:  96/66 124/65 104/60  Pulse:  75 78 62  Temp:  97.9 F (36.6 C) 98.8 F (37.1 C) 97.5 F (36.4 C)  TempSrc:  Oral Oral Oral  Resp:  16 16 16   Height:      Weight:      SpO2: 98% 98% 97% 99%    Intake/Output Summary (Last 24 hours) at 02/02/13 1235 Last data filed at 02/02/13 1000  Gross per 24 hour  Intake   2800 ml  Output   1550 ml  Net   1250 ml   Filed Weights   01/26/13 2254  Weight: 77.111 kg (170 lb)    Exam:   General: A./O. x1, no complaints. Denies any pain. No requests.  Cardiovascular: Regular rate, negative murmurs rubs gallops, DP/PT pulse one plus bilateral   Respiratory: Clear to auscultation bilateral   Abdomen: Soft, nontender, nondistended, plus bowel sounds  Genitourinary; patient has new Foley cath placed   Musculoskeletal: Negative pedal edema         Data Reviewed: Basic Metabolic Panel:  Recent Labs Lab 01/29/13 0340 01/30/13 0400 01/31/13 0541 02/01/13 0546 02/02/13 0420  NA 136 140 141 139 138  K 4.1 3.8 3.4* 3.8 3.5  CL 104 105 107 108 106  CO2 18* 23 25 20 25   GLUCOSE 130* 108* 130* 117* 118*  BUN 17 16 13 9 13   CREATININE 1.31 1.40* 1.26 1.13 1.39*  CALCIUM 8.9 9.2 8.6 8.4 8.3*  MG 2.1 2.1 1.9 1.8 1.9   Liver Function Tests:  Recent Labs Lab 01/29/13 0340 01/30/13 0400 01/31/13 0541 02/01/13 0546 02/02/13 0420  AST 27 30 22 18  33  ALT 13 23 16 15 27   ALKPHOS 97 109 89 87 97  BILITOT 0.5 0.5 0.3 0.3 0.3  PROT 6.2 6.3 5.3* 5.2* 5.0*  ALBUMIN 3.0* 3.2* 2.7* 2.5* 2.5*   No results found for this basename: LIPASE, AMYLASE,  in the last 168 hours No results found for this basename: AMMONIA,  in the last 168 hours CBC:  Recent Labs Lab 01/26/13 2027  01/28/13 1510 01/29/13 0340  01/30/13 0400 01/31/13 0541 02/01/13 0546 02/02/13 0420  WBC 14.7*  < > 11.1* 13.2* 12.7* 10.6* 13.0* 10.2  NEUTROABS 11.1*  --  7.2 8.3*  --   --   --   --   HGB 15.2  < > 14.9 15.7 16.0 13.2 13.1 12.1*  HCT 44.6  < > 42.6 45.1 45.7 37.8* 37.2* 35.8*  MCV 98.2  < > 96.6 97.6 97.0 96.9 95.9 97.0  PLT 223  < > 170 185 180 193 180 190  < > = values in this interval not displayed. Cardiac Enzymes: No results found for this basename: CKTOTAL, CKMB, CKMBINDEX, TROPONINI,  in the last 168 hours BNP (last 3 results) No results found for this basename: PROBNP,  in the last 8760 hours CBG: No results found for this basename: GLUCAP,  in the last 168 hours  Recent Results (from the past 240 hour(s))  URINE CULTURE     Status: None   Collection Time    01/26/13  8:27 PM      Result Value Range Status   Specimen Description URINE, RANDOM   Final   Special Requests NONE   Final   Culture  Setup Time     Final   Value: 01/27/2013 03:58  Performed at [facility] Count     Final   Value: NO GROWTH     Performed at [facility]   Culture     Final   Value: NO GROWTH     Performed at [facility]   Report Status 01/28/2013 FINAL   Final  CULTURE, BLOOD (ROUTINE X 2)     Status: None   Collection Time    01/26/13 10:30 PM      Result Value Range Status   Specimen Description BLOOD RIGHT FOREARM   Final   Special Requests BOTTLES DRAWN AEROBIC AND ANAEROBIC 5CC   Final   Culture  Setup Time     Final   Value: 01/27/2013 04:35     Performed at [facility]   Culture     Final   Value: NO GROWTH 5 DAYS     Performed at [facility]   Report Status 02/02/2013 FINAL   Final  CULTURE, BLOOD (ROUTINE X 2)     Status: None   Collection Time    01/26/13 10:40 PM      Result Value Range Status   Specimen Description BLOOD RIGHT WRIST   Final   Special Requests BOTTLES DRAWN AEROBIC AND ANAEROBIC 5CC   Final   Culture  Setup Time     Final    Value: 01/27/2013 04:36     Performed at [facility]   Culture     Final   Value: NO GROWTH 5 DAYS     Performed at [facility]   Report Status 02/02/2013 FINAL   Final     Studies: No results found.  Scheduled Meds: . aspirin  81 mg Oral q morning - 10a  . atorvastatin  20 mg Oral QHS  . calcium carbonate  1 tablet Oral BID  . cefTRIAXone (ROCEPHIN)  IV  1 g Intravenous Q24H  . citalopram  15 mg Oral q morning - 10a  . mirtazapine  15 mg Oral QHS  . tamsulosin  0.4 mg Oral q morning - 10a  . tiotropium  18 mcg Inhalation q morning - 10a   Continuous Infusions: . dextrose 100 mL/hr at 01/29/13 1757  . dextrose 5 % and 0.45% NaCl 100 mL/hr at 02/02/13 1222    Principal Problem:   Urinary tract infection Active Problems:   Urethral stricture unspecified   Hypertension   Dementia   ARF (acute renal failure)   Depression   HLD (hyperlipidemia)    Time spent: 20 minutes    Hollice Espy  [facility] Pager [phone]. If 7PM-7AM, please contact night-coverage at [email or web address], password [facility] 02/02/2013, 12:35 PM  LOS: 7 days

## 2013-02-02 NOTE — Progress Notes (Signed)
CSW continuing to follow for pt discharge planning needs.   Pt and pt family have chosen bed at [facility]. Per MD, anticipate d/c to [facility]. [facility] has submitted for insurance authorization through [employer].  CSW to continue to follow and facilitate pt discharge needs when pt medically ready for discharge and [employer] authorization received.   Jacklynn Lewis, MSW, LCSWA  Clinical Social Work [phone]

## 2013-02-03 LAB — COMPREHENSIVE METABOLIC PANEL
ALT: 27 U/L (ref 0–53)
BUN: 15 mg/dL (ref 6–23)
CO2: 28 mEq/L (ref 19–32)
Calcium: 8.9 mg/dL (ref 8.4–10.5)
Creatinine, Ser: 1.3 mg/dL (ref 0.50–1.35)
GFR calc Af Amer: 56 mL/min — ABNORMAL LOW (ref >90)
GFR calc non Af Amer: 48 mL/min — ABNORMAL LOW (ref >90)
Glucose, Bld: 103 mg/dL — ABNORMAL HIGH (ref 70–99)
Potassium: 3.7 mEq/L (ref 3.5–5.1)
Sodium: 136 mEq/L (ref 135–145)
Total Protein: 5.7 g/dL — ABNORMAL LOW (ref 6.0–8.3)

## 2013-02-03 LAB — CBC
Hemoglobin: 14.3 g/dL (ref 13.0–17.0)
MCH: 33.6 pg (ref 26.0–34.0)
MCHC: 34.6 g/dL (ref 30.0–36.0)
MCV: 97.2 fL (ref 78.0–100.0)

## 2013-02-03 LAB — MAGNESIUM: Magnesium: 1.9 mg/dL (ref 1.5–2.5)

## 2013-02-03 MED ORDER — OXYCODONE HCL 5 MG PO TABS: 5.0000 mg | ORAL_TABLET | Freq: Four times a day (QID) | ORAL | Status: DC | PRN

## 2013-02-03 NOTE — Discharge Summary (Signed)
Physician Discharge Summary  Ruben Morrison ZHY:[id number] DOB: 1926/01/28 DOA: 01/26/2013  PCP: No primary provider on file.  Admit date: 01/26/2013 Discharge date: 02/03/2013  Time spent: 35 minutes  Recommendations for Outpatient Follow-up:  1. Patient will need intermittent supervision with meals, specifically prompting at the start of meals to start eating. Continue supervision not needed 2. Patient will continue his Foley catheter until he follows up with urology 3. Patient will follow up with Dr. Annabell Howells, urology in 2-3 weeks 4. Patient is being discharged on prophylactic suppressive trimethoprim daily  Discharge Diagnoses:  Principal Problem:   Urinary tract infection Active Problems:   Urethral stricture unspecified   Hypertension   Dementia   ARF (acute renal failure)   Depression   HLD (hyperlipidemia)   Discharge Condition: Improved, being discharged to skilled nursing facility  Diet recommendation: Renal diet  Filed Weights   01/26/13 2254  Weight: 77.111 kg (170 lb)    History of present illness:  Patient is an 79 year old white male with past medical history of dementia, recurrent kidney stones was admitted a month ago for urinary retention who was admitted on 11/25 for frank hematuria. UTI noted positive nitrites and large leukocyte esterase, however full catheter was unable to be placed. Patient able to make urine, but bladder scan noted 200 cc of urine in his bladder. Patient was admitted to the hospitalist service and started on IV antibiotics, urology was consulted  Hospital Course:  Principal Problem:   Urinary tract infection: Patient's initial urine was noted to have significant infection. His initial start IV Rocephin. He completed a full course during his hospitalization. Urology recommending continuous daily Bactrim for suppressive therapy until they see him in 2-3 weeks  Active Problems:   Urethral stricture unspecified: [facility] was  consulted. Nursing attempts Repeatedly to Pl., Foley catheter were unsuccessful. On 11/30, patient was unable to void altogether and his residuals are greater than 600 cc. After discussion the patient's daughter, urologyattempted to place an 60 French Foley catheter and a 1 French coud-tip catheter without success. He then proceeded with cystoscopy was placed with urethral dilatation. Plan will be long-term Until seen by urology in 2-3 weeks    Hypertension: Stable. Patient had some mildly elevated blood pressure initially on admission, but has not required any long-term medications. He is not on any chronic long-term medications    Dementia: Stable. Patient able to follow commands. He has required prompting to initiate eating.    ARF (acute renal failure): Patient's initial creatinine on admission was 2.46. With IV hydration and correction of urinary obstruction, it has since come down to near baseline at 1.3. He'll be followed by urology    Depression: Stable    HLD (hyperlipidemia): Stable medical issues  Hypernatremia: Resolved. This is felt to be secondary to dehydration. Patient's initial sodium was 148. With IV fluids, patient's sodium came down to 139 11/27 and remained stable since.  Procedures:  Status post cystoscopy and placement of Foley catheter on 11/30  Consultations:  Urology  Discharge Exam: Filed Vitals:   02/03/13 0510  BP: 102/60  Pulse: 63  Temp: 97.3 F (36.3 C)  Resp: 18    General: Alert and oriented x1, no acute distress Cardiovascular: Regular rate and rhythm, S1 and S2 Respiratory: Clear to auscultation bilaterally  Discharge Instructions  Discharge Orders   Future Orders Complete By Expires   Continue foley catheter  As directed    Comments:     With appropriate care   Diet  renal [id number]  As directed    Increase activity slowly  As directed    Intermittent Supervision For Meals/Snacks  As directed    Comments:     With each meal,  patient should be prompted to eat, continue supervision but not needed       Medication List         aspirin 81 MG chewable tablet  Chew 81 mg by mouth every morning.     atorvastatin 20 MG tablet  Commonly known as:  LIPITOR  Take 20 mg by mouth at bedtime.     calcium carbonate 500 MG chewable tablet  Commonly known as:  TUMS - dosed in mg elemental calcium  Chew 1 tablet by mouth 2 (two) times daily.     CEROVITE SENIOR PO  Take 1 capsule by mouth 2 (two) times daily.     citalopram 10 MG tablet  Commonly known as:  CELEXA  Take 15 mg by mouth every morning.     mirtazapine 15 MG tablet  Commonly known as:  REMERON  Take 15 mg by mouth at bedtime.     oxyCODONE 5 MG immediate release tablet  Commonly known as:  Oxy IR/ROXICODONE  Take 1 tablet (5 mg total) by mouth every 6 (six) hours as needed for moderate pain.     polyethylene glycol packet  Commonly known as:  MIRALAX / GLYCOLAX  Take 17 g by mouth daily as needed for moderate constipation.     tamsulosin 0.4 MG Caps capsule  Commonly known as:  FLOMAX  Take 0.4 mg by mouth every morning.     tiotropium 18 MCG inhalation capsule  Commonly known as:  SPIRIVA  Place 18 mcg into inhaler and inhale every morning.     trimethoprim 100 MG tablet  Commonly known as:  TRIMPEX  Take 1 tablet (100 mg total) by mouth daily.       Allergies  Allergen Reactions  . Avelox [Moxifloxacin]     Reaction:Unknown Per medication MAR  . Ciprofloxacin     Unknown reaction- per MAR  . Floxin [Ofloxacin]     Unknown reaction- per [facility]       Follow-up Information   Follow up with Anner Crete, MD. (Call the office of an appt for 2-3 weeks. )    Specialty:  Urology   Contact information:   [address] [place] [address] [phone]        The results of significant diagnostics from this hospitalization (including imaging, microbiology, ancillary and laboratory) are listed below for reference.     Significant Diagnostic Studies: Dg Abd 1 View  02/02/2013     IMPRESSION: Grossly unchanged size and location of suspected adjacent renal stones within the superior aspect of the left ureter. Further evaluation could be performed with abdominal CT as clinically indicated.   Electronically Signed   By: Simonne Come M.D.   On: 02/02/2013 17:23   Dg Abd 1 View  01/14/2013    IMPRESSION: 1. Proximal left urolithiasis.  Similar finding noted on prior CT. 2. Bilateral nephrolithiasis. 3. Cholelithiasis. 4. Catheter noted over the bladder.  Prostate implant seeds.   Electronically Signed   By: Maisie Fus  Register   On: 01/14/2013 14:17   US Renal  01/31/2013    IMPRESSION: 1. Negative for hydronephrosis. 2. Moderate amount of debris layering within the urinary bladder. Some of the echogenic debris within the bladder somewhat rounded in could reflect stone fragments given  history of recent lithotripsy, or blood clots. 3. Renal sinus cysts left kidney. 4. Suspect 1 or 2 small nonobstructing stones in both kidneys.   Electronically Signed   By: Britta Mccreedy M.D.   On: 01/31/2013 09:49   US Renal  01/26/2013     IMPRESSION: No hydronephrosis in the kidneys. Stones identified at prior CT are not seen sonographically. There is diffuse wall thickening of the bladder with layering debris and color flow demonstrated. Changes could represent inflammatory change due to cystitis or transitional cell neoplasm.   Electronically Signed   By: Burman Nieves M.D.   On: 01/26/2013 23:43    Microbiology: Recent Results (from the past 240 hour(s))  URINE CULTURE     Status: None   Collection Time    01/26/13  8:27 PM      Result Value Range Status   Specimen Description URINE, RANDOM   Final   Special Requests NONE   Final   Culture  Setup Time     Final   Value: 01/27/2013 03:58     Performed at [facility] Count     Final   Value: NO GROWTH     Performed at [facility]   Culture      Final   Value: NO GROWTH     Performed at [facility]   Report Status 01/28/2013 FINAL   Final  CULTURE, BLOOD (ROUTINE X 2)     Status: None   Collection Time    01/26/13 10:30 PM      Result Value Range Status   Specimen Description BLOOD RIGHT FOREARM   Final   Special Requests BOTTLES DRAWN AEROBIC AND ANAEROBIC 5CC   Final   Culture  Setup Time     Final   Value: 01/27/2013 04:35     Performed at [facility]   Culture     Final   Value: NO GROWTH 5 DAYS     Performed at [facility]   Report Status 02/02/2013 FINAL   Final  CULTURE, BLOOD (ROUTINE X 2)     Status: None   Collection Time    01/26/13 10:40 PM      Result Value Range Status   Specimen Description BLOOD RIGHT WRIST   Final   Special Requests BOTTLES DRAWN AEROBIC AND ANAEROBIC 5CC   Final   Culture  Setup Time     Final   Value: 01/27/2013 04:36     Performed at [facility]   Culture     Final   Value: NO GROWTH 5 DAYS     Performed at [facility]   Report Status 02/02/2013 FINAL   Final     Labs: Basic Metabolic Panel:  Recent Labs Lab 01/30/13 0400 01/31/13 0541 02/01/13 0546 02/02/13 0420 02/03/13 0653  NA 140 141 139 138 136  K 3.8 3.4* 3.8 3.5 3.7  CL 105 107 108 106 102  CO2 23 25 20 25 28   GLUCOSE 108* 130* 117* 118* 103*  BUN 16 13 9 13 15   CREATININE 1.40* 1.26 1.13 1.39* 1.30  CALCIUM 9.2 8.6 8.4 8.3* 8.9  MG 2.1 1.9 1.8 1.9 1.9   Liver Function Tests:  Recent Labs Lab 01/30/13 0400 01/31/13 0541 02/01/13 0546 02/02/13 0420 02/03/13 0653  AST 30 22 18  33 29  ALT 23 16 15 27 27   ALKPHOS 109 89 87 97 109  BILITOT 0.5 0.3 0.3 0.3  0.2*  PROT 6.3 5.3* 5.2* 5.0* 5.7*  ALBUMIN 3.2* 2.7* 2.5* 2.5* 2.8*   No results found for this basename: LIPASE, AMYLASE,  in the last 168 hours No results found for this basename: AMMONIA,  in the last 168 hours CBC:  Recent Labs Lab 01/28/13 1510 01/29/13 0340 01/30/13 0400 01/31/13 0541  02/01/13 0546 02/02/13 0420 02/03/13 0435  WBC 11.1* 13.2* 12.7* 10.6* 13.0* 10.2 11.5*  NEUTROABS 7.2 8.3*  --   --   --   --   --   HGB 14.9 15.7 16.0 13.2 13.1 12.1* 14.3  HCT 42.6 45.1 45.7 37.8* 37.2* 35.8* 41.3  MCV 96.6 97.6 97.0 96.9 95.9 97.0 97.2  PLT 170 185 180 193 180 190 219     Signed:  Sharyon Peitz K  [facility] 02/03/2013, 12:29 PM

## 2013-02-03 NOTE — Progress Notes (Signed)
Attempted to call report x2 to [facility]. Receptionist unable to reach RN. This RN gave [employer] her work number to call for report.

## 2013-02-03 NOTE — Plan of Care (Signed)
Problem: Phase II Progression Outcomes Goal: Voiding independently Outcome: Not Progressing Foley catheter to remain at d/c. Pt will follow-up with urology in 2 weeks.

## 2013-02-03 NOTE — Progress Notes (Signed)
Patient ID: Kore Madlock, male   DOB: [birth year], 79 y.o.   MRN: [id number]  Mr. Elvin is doing well this morning and is awake and alert without major complaints.   On exam he has mild bilateral upper quadrant tenderness.  The foley is draining concentrated urine.  KUB from yesterday shows two residual stones in the left proximal ureter.  There are slightly changed in configuration from his pre-ESWL study suggesting fragmentation but there hasn't been real movement.    Imp:  Residual stones in the left proximal ureter post ESWL.          Prostatic stricture post seed implant with retention now doing well with a foley.  Rec:  I would like to give him some more time with the foley which hopefully will help soften the stricture and have him f/u in 2-3 weeks in my office for a KUB to reassess the stones.   If they don't change, then I will need to consider ureteroscopy and more formal management of his prostatic stricture.          Please continue the trimethoprim for suppression post discharge and give him enough for at least a month.          His daughter knows to call the office for the appt.

## 2013-02-03 NOTE — Progress Notes (Signed)
Pt for discharge to [facility].  CSW facilitated pt discharge needs including contacting facility, faxing pt discharge information via [facility], confirming with [facility] that [employer] authorization received, discussing with pt daughter via telephone, providing RN phone number to call report, and arranging ambulance transportation via [facility] to [facility], scheduled for 2:15 pm. (Service Request ID#: [id number]).  No further social work needs identified at this time.  CSW signing off.   Jacklynn Lewis, MSW, LCSWA  Clinical Social Work [phone]

## 2013-02-03 NOTE — Progress Notes (Signed)
Physical Therapy Treatment Patient Details Name: Ruben Morrison MRN: [id number] DOB: 11/12/1925 Today's Date: 02/03/2013 Time: 5621-3086 PT Time Calculation (min): 23 min  PT Assessment / Plan / Recommendation  History of Present Illness Ruben Morrison is a 79 y.o. 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  Patient presents with frank hematuria.  Family notes patient more lethargic than baseline as well.   PT Comments   Pt able to ambulate in hallway with assist today.  Pt appears to be following commands better then previous visits and progressing with mobility well.   Follow Up Recommendations  SNF     Does the patient have the potential to tolerate intense rehabilitation     Barriers to Discharge        Equipment Recommendations  None recommended by PT    Recommendations for Other Services    Frequency Min 3X/week   Progress towards PT Goals Progress towards PT goals: Goals met and updated - see care plan  Plan Current plan remains appropriate    Precautions / Restrictions Precautions Precautions: Fall Precaution Comments: dementia   Pertinent Vitals/Pain Denies pain    Mobility  Bed Mobility Bed Mobility: Supine to Sit Supine to Sit: 3: Mod assist;HOB elevated Sitting - Scoot to Edge of Bed: 4: Min guard Details for Bed Mobility Assistance: pt required increased multimodal cues in order to self-assist, assist for trunk upright Transfers Transfers: Stand to Sit;Sit to Stand Sit to Stand: 3: Mod assist;With armrests;From bed;From chair/3-in-1 Stand to Sit: With armrests;To chair/3-in-1;4: Min assist Details for Transfer Assistance: verbal cues for hand placement and placing feet on floor, forward lean, assist to rise and control descent Ambulation/Gait Ambulation/Gait Assistance: 4: Min assist Ambulation Distance (Feet): 42  Feet Assistive device: Rolling walker Ambulation/Gait Assistance Details: verbal cues for initiating, does better when therapist pushes RW to start forward momentum, cues for staying upright and within RW as pt tends to keep too far ahead, 17 feet then seated rest break and then another 25 feet Gait Pattern: Decreased stride length;Trunk flexed;Narrow base of support Gait velocity: decreased General Gait Details: +2 for safety and recliner    Exercises General Exercises - Lower Extremity Ankle Circles/Pumps: AROM;Both;15 reps;Supine Quad Sets: AROM;Both;15 reps;Supine Hip ABduction/ADduction: AROM;Both;10 reps;Supine   PT Diagnosis:    PT Problem List:   PT Treatment Interventions:     PT Goals (current goals can now be found in the care plan section)    Visit Information  Last PT Received On: 02/03/13 Assistance Needed: +2 History of Present Illness: Braun Rocca is a 79 y.o. 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  Patient presents with frank hematuria.  Family notes patient more lethargic than baseline as well.    Subjective Data      Cognition  Cognition Arousal/Alertness: Awake/alert Behavior During Therapy: WFL for tasks assessed/performed Overall Cognitive Status: History of cognitive impairments - at baseline (appears at baseline)    Balance     End of Session PT - End of Session Equipment Utilized During Treatment: Gait belt Activity Tolerance: Patient limited by fatigue Patient left: with call bell/phone within reach;in chair   GP     Ruben Morrison,Ruben Morrison 02/03/2013, 10:12 AM Ruben Morrison, PT, DPT 02/03/2013 Pager: [phone]

## 2013-02-03 NOTE — Progress Notes (Signed)
CSW received notification from MD that pt medically stable for discharge today.  CSW spoke with [facility] who confirmed that facility received insurance authorization from [facility].  CSW awaiting discharge information in order to facilitate pt discharge needs to [facility].  Jacklynn Lewis, MSW, LCSWA  Clinical Social Work [phone]

## 2013-02-10 ENCOUNTER — Non-Acute Institutional Stay (SKILLED_NURSING_FACILITY): Payer: Medicare HMO | Admitting: Internal Medicine

## 2013-02-10 DIAGNOSIS — F0390 Unspecified dementia without behavioral disturbance: Secondary | ICD-10-CM

## 2013-02-10 DIAGNOSIS — I10 Essential (primary) hypertension: Secondary | ICD-10-CM

## 2013-02-10 DIAGNOSIS — N35919 Unspecified urethral stricture, male, unspecified site: Secondary | ICD-10-CM

## 2013-02-10 DIAGNOSIS — N390 Urinary tract infection, site not specified: Secondary | ICD-10-CM

## 2013-02-17 ENCOUNTER — Emergency Department (HOSPITAL_COMMUNITY)
Admission: EM | Admit: 2013-02-17 | Discharge: 2013-02-17 | Disposition: A | Discharge status: Home / Self Care | Payer: Medicare HMO | Attending: Emergency Medicine | Admitting: Emergency Medicine

## 2013-02-17 DIAGNOSIS — Z951 Presence of aortocoronary bypass graft: Secondary | ICD-10-CM | POA: Insufficient documentation

## 2013-02-17 DIAGNOSIS — T8389XA Other specified complication of genitourinary prosthetic devices, implants and grafts, initial encounter: Secondary | ICD-10-CM | POA: Insufficient documentation

## 2013-02-17 DIAGNOSIS — T83021A Displacement of indwelling urethral catheter, initial encounter: Secondary | ICD-10-CM

## 2013-02-17 DIAGNOSIS — Z87448 Personal history of other diseases of urinary system: Secondary | ICD-10-CM | POA: Insufficient documentation

## 2013-02-17 DIAGNOSIS — I2510 Atherosclerotic heart disease of native coronary artery without angina pectoris: Secondary | ICD-10-CM | POA: Insufficient documentation

## 2013-02-17 DIAGNOSIS — Z8701 Personal history of pneumonia (recurrent): Secondary | ICD-10-CM | POA: Insufficient documentation

## 2013-02-17 DIAGNOSIS — Y846 Urinary catheterization as the cause of abnormal reaction of the patient, or of later complication, without mention of misadventure at the time of the procedure: Secondary | ICD-10-CM | POA: Insufficient documentation

## 2013-02-17 DIAGNOSIS — Z8546 Personal history of malignant neoplasm of prostate: Secondary | ICD-10-CM | POA: Insufficient documentation

## 2013-02-17 DIAGNOSIS — Z7982 Long term (current) use of aspirin: Secondary | ICD-10-CM | POA: Insufficient documentation

## 2013-02-17 DIAGNOSIS — Z79899 Other long term (current) drug therapy: Secondary | ICD-10-CM | POA: Insufficient documentation

## 2013-02-17 DIAGNOSIS — I10 Essential (primary) hypertension: Secondary | ICD-10-CM | POA: Insufficient documentation

## 2013-02-17 DIAGNOSIS — F0390 Unspecified dementia without behavioral disturbance: Secondary | ICD-10-CM | POA: Insufficient documentation

## 2013-02-17 MED ORDER — OXYCODONE-ACETAMINOPHEN 5-325 MG PO TABS
1.0000 | ORAL_TABLET | Freq: Once | ORAL | Status: AC
  Administered 2013-02-17: 1 via ORAL
  Filled 2013-02-17: qty 1

## 2013-02-17 NOTE — ED Notes (Signed)
Bed: WA02 Expected date: 02/17/13 Expected time: 12:24 AM Means of arrival: Ambulance Comments: Pulled out catheter

## 2013-02-17 NOTE — ED Provider Notes (Signed)
CSN: [id number]     Arrival date & time 02/17/13  0037 History   First MD Initiated Contact with Patient 02/17/13 0046     Chief Complaint  Patient presents with  . Catheter pulled out    (Consider location/radiation/quality/duration/timing/severity/associated sxs/prior Treatment) HPI 79 year old male presents to emergency department from nursing home via EMS with reported removal of Foley catheter.  It is reported that patient pulled out his catheter with balloon still inflated.  Patient had Foley catheter placed during recent hospitalization due to stricture and urinary retention.  Per notes, patient to be followed up into 3 weeks after his recent discharge for voiding trial.  Patient has followup on Monday for this trial.  Patient has dementia, reports some pain in his penis, no other complaints at this time.  Per notes, patient required cystoscopy and dilation in the OR for placement of Foley catheter.  During his most recent visit. Past Medical History  Diagnosis Date  . Dementia   . Renal disorder   . Pneumococcal pneumonia   . Hypertension   . Encephalopathy acute   . Prostate cancer   . Coronary artery disease    Past Surgical History  Procedure Laterality Date  . Cardiac surgery    . Insertion prostate radiation seed    . Inguinal hernia repair    . Eye surgery      cataract  . Coronary artery bypass graft  1996    [place], [place]   No family history on file. History  Substance Use Topics  . Smoking status: Never Smoker   . Smokeless tobacco: Never Used  . Alcohol Use: No    Review of Systems  Unable to perform ROS: Dementia    Allergies  Avelox; Ciprofloxacin; and Floxin  Home Medications   Current Outpatient Rx  Name  Route  Sig  Dispense  Refill  . aspirin 81 MG chewable tablet   Oral   Chew 81 mg by mouth every morning.          Marland Kitchen atorvastatin (LIPITOR) 20 MG tablet   Oral   Take 20 mg by mouth at bedtime.          . calcium carbonate (TUMS -  DOSED IN MG ELEMENTAL CALCIUM) 500 MG chewable tablet   Oral   Chew 1 tablet by mouth 2 (two) times daily.         . citalopram (CELEXA) 10 MG tablet   Oral   Take 15 mg by mouth every morning.          . mirtazapine (REMERON) 15 MG tablet   Oral   Take 15 mg by mouth at bedtime.         . Multiple Vitamins-Minerals (CEROVITE SENIOR PO)   Oral   Take 1 capsule by mouth 2 (two) times daily.         Marland Kitchen oxyCODONE (OXY IR/ROXICODONE) 5 MG immediate release tablet   Oral   Take 1 tablet (5 mg total) by mouth every 6 (six) hours as needed for moderate pain.   30 tablet   0   . oxyCODONE-acetaminophen (PERCOCET/ROXICET) 5-325 MG per tablet   Oral   Take 1 tablet by mouth every 4 (four) hours as needed for severe pain.         . Tamsulosin HCl (FLOMAX) 0.4 MG CAPS   Oral   Take 0.4 mg by mouth every morning.          . tiotropium (SPIRIVA) 18 MCG  inhalation capsule   Inhalation   Place 18 mcg into inhaler and inhale every morning.          . trimethoprim (TRIMPEX) 100 MG tablet   Oral   Take 1 tablet (100 mg total) by mouth daily.   30 tablet   0   . polyethylene glycol (MIRALAX / GLYCOLAX) packet   Oral   Take 17 g by mouth daily as needed for moderate constipation.           BP 109/62  Pulse 59  Temp(Src) 98.5 F (36.9 C) (Oral)  Resp 18  SpO2 99% Physical Exam  Nursing note and vitals reviewed. Constitutional: He appears well-developed and well-nourished.  HENT:  Head: Normocephalic and atraumatic.  Nose: Nose normal.  Mouth/Throat: Oropharynx is clear and moist.  Eyes: Conjunctivae and EOM are normal. Pupils are equal, round, and reactive to light.  Neck: Normal range of motion. Neck supple. No JVD present. No tracheal deviation present. No thyromegaly present.  Cardiovascular: Normal rate, regular rhythm, normal heart sounds and intact distal pulses.  Exam reveals no gallop and no friction rub.   No murmur heard. Pulmonary/Chest: Effort normal  and breath sounds normal. No stridor. No respiratory distress. He has no wheezes. He has no rales. He exhibits no tenderness.  Abdominal: Soft. Bowel sounds are normal. He exhibits no distension and no mass. There is no tenderness. There is no rebound and no guarding.  Genitourinary:  Patient with blood at the meatus.  Poor hygiene with thick white discharge at glans  Musculoskeletal: Normal range of motion. He exhibits no edema and no tenderness.  Lymphadenopathy:    He has no cervical adenopathy.  Neurological: He is alert. He exhibits normal muscle tone. Coordination normal.  Skin: Skin is warm and dry. No rash noted. No erythema. No pallor.  Psychiatric: He has a normal mood and affect. His behavior is normal. Judgment and thought content normal.    ED Course  BLADDER CATHETERIZATION Date/Time: 02/17/2013 1:57 AM Performed by: Olivia Mackie Authorized by: Olivia Mackie Indications: urinary retention and urine output monitoring Local anesthesia used: no Patient sedated: no Preparation: Patient was prepped and draped in the usual sterile fashion. Catheter insertion: indwelling Catheter type: coude Catheter size: 16 Fr Complicated insertion: no Altered anatomy: no Bladder irrigation: no Number of attempts: 1 Urine characteristics: mildly cloudy Patient tolerance: Patient tolerated the procedure well with no immediate complications.   (including critical care time) Labs Review Labs Reviewed - No data to display Imaging Review No results found.  EKG Interpretation   None       MDM   1. Dislodged Foley catheter, initial encounter    79 year old male with dislodged Foley catheter.  Foley catheter, replaced without difficulty.  Patient has followup with urology scheduled for next Monday.   Olivia Mackie, MD 02/17/13 [phone]

## 2013-02-17 NOTE — ED Notes (Signed)
Pt has hx of dementia and pulled out his catheter tonight, bulb intact. [facility] sent him in because he has hx of urinary stricture. Did not attempt to replace at SNF. Penile bleeding.

## 2013-02-19 NOTE — Progress Notes (Signed)
Patient ID: Ruben Morrison, male   DOB: [birth year], 79 y.o.   MRN: [id number]        HISTORY & PHYSICAL  DATE: 01/14/2013     FACILITY: [facility]  LEVEL OF CARE: SNF (31)  ALLERGIES:  Allergies  Allergen Reactions  . Avelox [Moxifloxacin]     Reaction:Unknown Per medication MAR  . Ciprofloxacin     Unknown reaction- per MAR  . Floxin [Ofloxacin]     Unknown reaction- per MAR    CHIEF COMPLAINT:  Manage dementia, hypertension, and CAD.    HISTORY OF PRESENT ILLNESS:  The patient is an 79 year-old, Caucasian male who was hospitalized secondary to progressive worsening lower abdominal pain.  He was diagnosed with a UTI, urethral stricture and prostatic stricture.  After hospitalization, he is admitted to this facility for short-term rehabilitation.   He has the following problems:    DEMENTIA: The dementia remains stable and continues to function adequately in the current living environment with supervision.  The patient has had little changes in behavior. No complications noted from the medications presently being used.    HTN: Pt 's HTN remains stable.  Denies CP, sob, DOE, pedal edema, headaches, dizziness or visual disturbances.  No complications from the medications currently being used.  Last BP :   100/61.    CAD: The angina has been stable. The patient denies dyspnea on exertion, orthopnea, pedal edema, palpitations and paroxysmal nocturnal dyspnea. No complications noted from the medication presently being used.    PAST MEDICAL HISTORY :  Past Medical History  Diagnosis Date  . Dementia   . Renal disorder   . Pneumococcal pneumonia   . Hypertension   . Encephalopathy acute   . Prostate cancer   . Coronary artery disease     PAST SURGICAL HISTORY: Past Surgical History  Procedure Laterality Date  . Cardiac surgery    . Insertion prostate radiation seed    . Inguinal hernia repair    . Eye surgery      cataract  . Coronary artery bypass graft   1996    [place], [place]    SOCIAL HISTORY:  reports that he has never smoked. He has never used smokeless tobacco. He reports that he does not drink alcohol or use illicit drugs.  FAMILY HISTORY: None  CURRENT MEDICATIONS: Reviewed per MAR  REVIEW OF SYSTEMS:  See HPI otherwise 14 point ROS is negative.  PHYSICAL EXAMINATION  VS:  T 98.2       P 64      RR 18      BP 100/61      POX%        WT (Lb) 170    GENERAL: no acute distress, moderately obese body habitus EYES: conjunctivae normal, sclerae normal, normal eye lids MOUTH/THROAT: lips without lesions,no lesions in the mouth,tongue is without lesions,uvula elevates in midline NECK: supple, trachea midline, no neck masses, no thyroid tenderness, no thyromegaly LYMPHATICS: no LAN in the neck, no supraclavicular LAN RESPIRATORY: breathing is even & unlabored, BS CTAB CARDIAC: RRR, no murmur,no extra heart sounds, no edema GI:  ABDOMEN: abdomen soft, normal BS, no masses, no tenderness  LIVER/SPLEEN: no hepatomegaly, no splenomegaly MUSCULOSKELETAL: HEAD: normal to inspection & palpation BACK: no kyphosis, scoliosis or spinal processes tenderness EXTREMITIES: LEFT UPPER EXTREMITY: full range of motion, normal strength & tone RIGHT UPPER EXTREMITY:  full range of motion, normal strength & tone LEFT LOWER EXTREMITY: strength intact, range of  motion moderate   RIGHT LOWER EXTREMITY: strength intact, range of motion moderate   PSYCHIATRIC: the patient is alert & oriented to person, affect & behavior appropriate  LABS/RADIOLOGY: CT of the abdomen and pelvis:  No recurrent or metastatic carcinoma, two calculi in left renal pelvis measuring up to 7 mm.  Chest x-ray:  Chronic interstitial lung disease.    CT of the head:  No acute findings.    Urine culture showed no growth.    BMP normal.    Albumin 3.1, otherwise liver profile normal.    Hemoglobin 12.5, MCV 97.6, otherwise CBC normal.    Labs reviewed: Basic Metabolic  Panel:  Recent Labs  02/01/13 0546 02/02/13 0420 02/03/13 0653  NA 139 138 136  K 3.8 3.5 3.7  CL 108 106 102  CO2 20 25 28   GLUCOSE 117* 118* 103*  BUN 9 13 15   CREATININE 1.13 1.39* 1.30  CALCIUM 8.4 8.3* 8.9  MG 1.8 1.9 1.9   Liver Function Tests:  Recent Labs  02/01/13 0546 02/02/13 0420 02/03/13 0653  AST 18 33 29  ALT 15 27 27   ALKPHOS 87 97 109  BILITOT 0.3 0.3 0.2*  PROT 5.2* 5.0* 5.7*  ALBUMIN 2.5* 2.5* 2.8*    Recent Labs  01/03/13 0929  LIPASE 52   CBC:  Recent Labs  01/26/13 2027  01/28/13 1510 01/29/13 0340  02/01/13 0546 02/02/13 0420 02/03/13 0435  WBC 14.7*  < > 11.1* 13.2*  < > 13.0* 10.2 11.5*  NEUTROABS 11.1*  --  7.2 8.3*  --   --   --   --   HGB 15.2  < > 14.9 15.7  < > 13.1 12.1* 14.3  HCT 44.6  < > 42.6 45.1  < > 37.2* 35.8* 41.3  MCV 98.2  < > 96.6 97.6  < > 95.9 97.0 97.2  PLT 223  < > 170 185  < > 180 190 219  < > = values in this interval not displayed.  ASSESSMENT/PLAN:  Dementia.  Stable.    Hypertension.  Well controlled.    CAD.  Stable.    Urethral and prostatic strictures.  Status post cystoscopy with urethral dilatation and catheter placement.   Currently on trimethoprim.    History of prostate cancer.  No recurrence.    Check CBC and BMP.    I have reviewed patient's medical records received at admission/from hospitalization.    CPT CODE: 36644

## 2013-02-26 ENCOUNTER — Inpatient Hospital Stay (HOSPITAL_COMMUNITY)
Admission: EM | Admit: 2013-02-26 | Discharge: 2013-03-03 | DRG: 871 | Disposition: A | Discharge status: Hospice | Payer: Medicare HMO | Attending: Internal Medicine | Admitting: Internal Medicine

## 2013-02-26 ENCOUNTER — Emergency Department (HOSPITAL_COMMUNITY): Payer: Medicare HMO

## 2013-02-26 ENCOUNTER — Other Ambulatory Visit: Payer: Self-pay

## 2013-02-26 DIAGNOSIS — N184 Chronic kidney disease, stage 4 (severe): Secondary | ICD-10-CM | POA: Diagnosis present

## 2013-02-26 DIAGNOSIS — Z8546 Personal history of malignant neoplasm of prostate: Secondary | ICD-10-CM

## 2013-02-26 DIAGNOSIS — A419 Sepsis, unspecified organism: Principal | ICD-10-CM | POA: Diagnosis present

## 2013-02-26 DIAGNOSIS — N390 Urinary tract infection, site not specified: Secondary | ICD-10-CM | POA: Diagnosis present

## 2013-02-26 DIAGNOSIS — N135 Crossing vessel and stricture of ureter without hydronephrosis: Secondary | ICD-10-CM | POA: Diagnosis present

## 2013-02-26 DIAGNOSIS — N179 Acute kidney failure, unspecified: Secondary | ICD-10-CM | POA: Diagnosis present

## 2013-02-26 DIAGNOSIS — F329 Major depressive disorder, single episode, unspecified: Secondary | ICD-10-CM

## 2013-02-26 DIAGNOSIS — R509 Fever, unspecified: Secondary | ICD-10-CM

## 2013-02-26 DIAGNOSIS — K560 Paralytic ileus: Secondary | ICD-10-CM | POA: Diagnosis present

## 2013-02-26 DIAGNOSIS — Z66 Do not resuscitate: Secondary | ICD-10-CM | POA: Diagnosis present

## 2013-02-26 DIAGNOSIS — I129 Hypertensive chronic kidney disease with stage 1 through stage 4 chronic kidney disease, or unspecified chronic kidney disease: Secondary | ICD-10-CM | POA: Diagnosis present

## 2013-02-26 DIAGNOSIS — I10 Essential (primary) hypertension: Secondary | ICD-10-CM

## 2013-02-26 DIAGNOSIS — R338 Other retention of urine: Secondary | ICD-10-CM

## 2013-02-26 DIAGNOSIS — F0390 Unspecified dementia without behavioral disturbance: Secondary | ICD-10-CM | POA: Diagnosis present

## 2013-02-26 DIAGNOSIS — Z515 Encounter for palliative care: Secondary | ICD-10-CM

## 2013-02-26 DIAGNOSIS — J189 Pneumonia, unspecified organism: Secondary | ICD-10-CM

## 2013-02-26 DIAGNOSIS — Z951 Presence of aortocoronary bypass graft: Secondary | ICD-10-CM

## 2013-02-26 DIAGNOSIS — G9340 Encephalopathy, unspecified: Secondary | ICD-10-CM | POA: Diagnosis present

## 2013-02-26 DIAGNOSIS — Z87440 Personal history of urinary (tract) infections: Secondary | ICD-10-CM

## 2013-02-26 DIAGNOSIS — I2510 Atherosclerotic heart disease of native coronary artery without angina pectoris: Secondary | ICD-10-CM | POA: Diagnosis present

## 2013-02-26 DIAGNOSIS — J111 Influenza due to unidentified influenza virus with other respiratory manifestations: Secondary | ICD-10-CM

## 2013-02-26 DIAGNOSIS — Z7982 Long term (current) use of aspirin: Secondary | ICD-10-CM

## 2013-02-26 DIAGNOSIS — Z87442 Personal history of urinary calculi: Secondary | ICD-10-CM

## 2013-02-26 DIAGNOSIS — J9600 Acute respiratory failure, unspecified whether with hypoxia or hypercapnia: Secondary | ICD-10-CM | POA: Diagnosis present

## 2013-02-26 DIAGNOSIS — N201 Calculus of ureter: Secondary | ICD-10-CM

## 2013-02-26 DIAGNOSIS — C61 Malignant neoplasm of prostate: Secondary | ICD-10-CM

## 2013-02-26 DIAGNOSIS — E785 Hyperlipidemia, unspecified: Secondary | ICD-10-CM

## 2013-02-26 DIAGNOSIS — N35919 Unspecified urethral stricture, male, unspecified site: Secondary | ICD-10-CM

## 2013-02-26 DIAGNOSIS — J1100 Influenza due to unidentified influenza virus with unspecified type of pneumonia: Secondary | ICD-10-CM | POA: Diagnosis present

## 2013-02-26 DIAGNOSIS — Z856 Personal history of leukemia: Secondary | ICD-10-CM

## 2013-02-26 DIAGNOSIS — R4182 Altered mental status, unspecified: Secondary | ICD-10-CM | POA: Diagnosis present

## 2013-02-26 LAB — CBC WITH DIFFERENTIAL/PLATELET
Basophil %: 1.2 %
Basophils Relative: 0 % (ref 0–1)
Eosinophil #: 0 10*3/uL (ref 0.0–0.7)
Eosinophils Absolute: 0.1 10*3/uL (ref 0.0–0.7)
Eosinophils Relative: 1 % (ref 0–5)
HGB: 13.2 g/dL (ref 13.0–18.0)
Hemoglobin: 13.5 g/dL (ref 13.0–17.0)
Lymphs Abs: 0.4 10*3/uL — ABNORMAL LOW (ref 0.7–4.0)
MCH: 33 pg (ref 26.0–34.0)
MCH: 33.3 pg (ref 26.0–34.0)
MCHC: 32.9 g/dL (ref 32.0–36.0)
MCHC: 33.3 g/dL (ref 30.0–36.0)
Monocyte %: 5.2 %
Monocytes Relative: 2 % — ABNORMAL LOW (ref 3–12)
Neutrophil #: 12.5 10*3/uL — ABNORMAL HIGH (ref 1.4–6.5)
Neutrophil %: 90.5 %
Neutrophils Relative %: 94 % — ABNORMAL HIGH (ref 43–77)
Platelet: 224 10*3/uL (ref 150–440)
RBC: 4 10*6/uL — ABNORMAL LOW (ref 4.40–5.90)
RDW: 13.8 % (ref 11.5–14.5)
WBC: 15.6 10*3/uL — ABNORMAL HIGH (ref 4.0–10.5)

## 2013-02-26 LAB — BASIC METABOLIC PANEL
BUN: 23 mg/dL — ABNORMAL HIGH (ref 7–18)
Co2: 25 mmol/L (ref 21–32)
Creatinine: 1.42 mg/dL — ABNORMAL HIGH (ref 0.60–1.30)
EGFR (African American): 52 — ABNORMAL LOW
EGFR (Non-African Amer.): 45 — ABNORMAL LOW
Glucose: 113 mg/dL — ABNORMAL HIGH (ref 65–99)
Potassium: 4.1 mmol/L (ref 3.5–5.1)
Sodium: 141 mmol/L (ref 136–145)

## 2013-02-26 MED ORDER — DEXTROSE 5 % IV SOLN
2.0000 g | Freq: Once | INTRAVENOUS | Status: AC
  Administered 2013-02-26: 2 g via INTRAVENOUS
  Filled 2013-02-26: qty 2

## 2013-02-26 MED ORDER — ACETAMINOPHEN 650 MG RE SUPP
650.0000 mg | RECTAL | Status: DC | PRN
  Administered 2013-02-26: 650 mg via RECTAL
  Filled 2013-02-26: qty 1

## 2013-02-26 MED ORDER — VANCOMYCIN HCL IN DEXTROSE 1-5 GM/200ML-% IV SOLN
1000.0000 mg | Freq: Once | INTRAVENOUS | Status: AC
  Administered 2013-02-27: 1000 mg via INTRAVENOUS
  Filled 2013-02-26: qty 200

## 2013-02-26 NOTE — ED Notes (Signed)
Bed: YN82 Expected date: 02/26/13 Expected time: 7:55 PM Means of arrival: Ambulance Comments: Fever, pna, uti

## 2013-02-26 NOTE — ED Notes (Signed)
Per EMS, pt is from [facility]. Nursing home called due to pt having fever (103) and deceased level of conciousness. Pt is on 2L at nursing home and EMS states he was coughing. Nursing home is concerned about possible pneumonia or uti

## 2013-02-26 NOTE — ED Provider Notes (Signed)
CSN: [id number]     Arrival date & time 02/26/13  2018 History   First MD Initiated Contact with Patient 02/26/13 2058     Chief Complaint  Patient presents with  . Fever  . Altered Mental Status   (Consider location/radiation/quality/duration/timing/severity/associated sxs/prior Treatment) HPI Comments: Patient is an 79 yo M PMHx significant for dementia, renal disorder, HTN, prostate cancer, chronic indwelling catheter, CAD, luekemia brought into the ED from [facility] home for a fever of 103F  with decreased level of consciousness PTA. According to the daughter (and power of attorney) the nursing home is concerned that the patient may have a pneumonia as the patient has had a productive cough for a few days now. They were also concerned about the possibility of UTI d/t indwelling foley. The patient last had his foley changed 1-2 months ago by Dr. Marcello Fennel. Patient was hospitalized over Thanksgiving for fever. Patient is a level V caveat d/t dementia. Patient is a DNR.    Past Medical History  Diagnosis Date  . Dementia   . Renal disorder   . Pneumococcal pneumonia   . Hypertension   . Encephalopathy acute   . Prostate cancer   . Coronary artery disease    Past Surgical History  Procedure Laterality Date  . Cardiac surgery    . Insertion prostate radiation seed    . Inguinal hernia repair    . Eye surgery      cataract  . Coronary artery bypass graft  1996    [place], [place]   No family history on file. History  Substance Use Topics  . Smoking status: Never Smoker   . Smokeless tobacco: Never Used  . Alcohol Use: No    Review of Systems  Unable to perform ROS: Dementia  Constitutional: Positive for fever.    Allergies  Avelox; Ciprofloxacin; and Floxin  Home Medications   Current Outpatient Rx  Name  Route  Sig  Dispense  Refill  . acetaminophen (TYLENOL) 650 MG suppository   Rectal   Place 650 mg rectally every 4 (four) hours as needed for  moderate pain or fever.         Marland Kitchen albuterol (PROVENTIL) (2.5 MG/3ML) 0.083% nebulizer solution   Nebulization   Take 2.5 mg by nebulization every 4 (four) hours as needed for wheezing or shortness of breath.         Marland Kitchen aspirin 81 MG chewable tablet   Oral   Chew 81 mg by mouth every morning.          Marland Kitchen atorvastatin (LIPITOR) 20 MG tablet   Oral   Take 20 mg by mouth at bedtime.          . calcium carbonate (TUMS - DOSED IN MG ELEMENTAL CALCIUM) 500 MG chewable tablet   Oral   Chew 1 tablet by mouth 2 (two) times daily.         . citalopram (CELEXA) 10 MG tablet   Oral   Take 15 mg by mouth every morning.          Marland Kitchen erythromycin ophthalmic ointment   Both Eyes   Place 1 application into both eyes 4 (four) times daily.         . mirtazapine (REMERON) 15 MG tablet   Oral   Take 15 mg by mouth at bedtime.         . Multiple Vitamins-Minerals (CEROVITE SENIOR PO)   Oral   Take 1 capsule by mouth 2 (  two) times daily.         Marland Kitchen oxyCODONE (OXY IR/ROXICODONE) 5 MG immediate release tablet   Oral   Take 1 tablet (5 mg total) by mouth every 6 (six) hours as needed for moderate pain.   30 tablet   0   . polyethylene glycol (MIRALAX / GLYCOLAX) packet   Oral   Take 17 g by mouth daily as needed for moderate constipation.          . Tamsulosin HCl (FLOMAX) 0.4 MG CAPS   Oral   Take 0.4 mg by mouth every morning.          . tiotropium (SPIRIVA) 18 MCG inhalation capsule   Inhalation   Place 18 mcg into inhaler and inhale every morning.          . trimethoprim (TRIMPEX) 100 MG tablet   Oral   Take 1 tablet (100 mg total) by mouth daily.   30 tablet   0    BP 104/58  Pulse 93  Temp(Src) 102 F (38.9 C) (Rectal)  Resp 22  SpO2 96% Physical Exam  Constitutional: He appears well-developed and well-nourished. He appears ill. Nasal cannula in place.  Patient is arousable.   HENT:  Head: Normocephalic and atraumatic.  Right Ear: External ear  normal.  Left Ear: External ear normal.  Nose: Nose normal.  Eyes: Right eye exhibits discharge. Left eye exhibits discharge.  Neck: Neck supple.  Cardiovascular: Normal rate, regular rhythm, normal heart sounds and intact distal pulses.   Pulmonary/Chest: No stridor. Tachypnea noted. He has decreased breath sounds. He has no wheezes. He exhibits no tenderness.  Abdominal: Soft. Bowel sounds are normal. He exhibits no distension. There is no tenderness. There is no rebound and no guarding.  Genitourinary:  Indwelling foley in place.   Musculoskeletal: He exhibits no edema.  Lymphadenopathy:    He has no cervical adenopathy.  Skin: Skin is warm and dry. He is not diaphoretic. There is erythema.    ED Course  Procedures (including critical care time)  Medications  acetaminophen (TYLENOL) suppository 650 mg (650 mg Rectal Given 02/26/13 2326)  vancomycin (VANCOCIN) IVPB 1000 mg/200 mL premix (1,000 mg Intravenous New Bag/Given 02/27/13 0024)  albuterol (PROVENTIL) (5 MG/ML) 0.5% nebulizer solution 2.5 mg (not administered)  0.9 %  sodium chloride infusion (not administered)  ceFEPIme (MAXIPIME) 2 g in dextrose 5 % 50 mL IVPB (2 g Intravenous New Bag/Given 02/26/13 2322)    Labs Review Labs Reviewed  CBC WITH DIFFERENTIAL - Abnormal; Notable for the following:    WBC 15.6 (*)    RBC 4.06 (*)    Neutrophils Relative % 94 (*)    Neutro Abs 14.7 (*)    Lymphocytes Relative 3 (*)    Lymphs Abs 0.4 (*)    Monocytes Relative 2 (*)    All other components within normal limits  COMPREHENSIVE METABOLIC PANEL - Abnormal; Notable for the following:    Glucose, Bld 117 (*)    BUN 24 (*)    Creatinine, Ser 1.39 (*)    Albumin 3.1 (*)    AST 42 (*)    GFR calc non Af Amer 45 (*)    GFR calc Af Amer 52 (*)    All other components within normal limits  URINALYSIS, ROUTINE W REFLEX MICROSCOPIC - Abnormal; Notable for the following:    Color, Urine AMBER (*)    APPearance TURBID (*)     Hgb urine dipstick LARGE (*)  Protein, ur 30 (*)    Nitrite POSITIVE (*)    Leukocytes, UA LARGE (*)    All other components within normal limits  URINE MICROSCOPIC-ADD ON - Abnormal; Notable for the following:    Bacteria, UA MANY (*)    All other components within normal limits  CG4 I-STAT (LACTIC ACID) - Abnormal; Notable for the following:    Lactic Acid, Venous 4.70 (*)    All other components within normal limits  URINE CULTURE  CULTURE, BLOOD (ROUTINE X 2)  CULTURE, BLOOD (ROUTINE X 2)   Imaging Review Dg Chest Portable 1 View  02/26/2013   CLINICAL DATA:  Altered mental status, cough and fever.  EXAM: PORTABLE CHEST - 1 VIEW  COMPARISON:  Chest radiograph performed 01/03/2013  FINDINGS: The lungs are hypoexpanded. Vascular congestion is seen, with bilateral patchy airspace opacities. This may reflect pneumonia or mild interstitial edema. No pleural effusion or pneumothorax is identified.  The cardiomediastinal silhouette is borderline enlarged. The patient is status post median sternotomy, with evidence of prior CABG. No acute osseous abnormalities are seen.  IMPRESSION: Lungs hypoexpanded. Vascular congestion and borderline cardiomegaly, with patchy bilateral airspace opacities. This may reflect pneumonia or mild interstitial edema.   Electronically Signed   By: Roanna Raider M.D.   On: 02/26/2013 22:42    EKG Interpretation   None      CRITICAL CARE Performed by: Francee Piccolo L   Total critical care time: 30 minutes  Critical care time was exclusive of separately billable procedures and treating other patients.  Critical care was necessary to treat or prevent imminent or life-threatening deterioration.  Critical care was time spent personally by me on the following activities: development of treatment plan with patient and/or surrogate as well as nursing, discussions with consultants, evaluation of patient's response to treatment, examination of patient,  obtaining history from patient or surrogate, ordering and performing treatments and interventions, ordering and review of laboratory studies, ordering and review of radiographic studies, pulse oximetry and re-evaluation of patient's condition.   MDM   1. Fever   2. Hospital-acquired pneumonia   3. UTI (lower urinary tract infection)   4. Chronic indwelling Foley catheter    Patient presenting with fever and decreased level of consciousness from nursing home. Patient is ill appearing. Sepsis criteria met, and code initiated. Vancomycin and Cefepime started to cover for HCAP and chronic indwelling foley sources of infection and cause of fever. CXR confirms PNA. UA positive for UTI. Tylenol suppository given for fever. I have reviewed nursing notes, vital signs, and all appropriate lab and imaging results for this patient. Patient will be admitted to stepdown unit for further evaluation and treatment. Patient d/w with Dr. Micheline Maze, agrees with plan.        Jeannetta Ellis, PA-C 02/27/13 0116

## 2013-02-26 NOTE — Progress Notes (Signed)
   CARE MANAGEMENT ED NOTE 02/26/2013  Patient:  BRANDONN, CAPELLI   Account Number:  [id number]  Date Initiated:  02/26/2013  Documentation initiated by:  Radford Pax  Subjective/Objective Assessment:   Patient presents to Ed with fever and decreased level of consciousness.     Subjective/Objective Assessment Detail:   Patient is from [facility].     Action/Plan:   Action/Plan Detail:   Anticipated DC Date:       Status Recommendation to Physician:   Result of Recommendation:    Other ED Services  Consult Working Plan    DC Planning Services  Other  PCP issues    Choice offered to / List presented to:            Status of service:  Completed, signed off  ED Comments:   ED Comments Detail:  EDCM spoke to patient's daughter Shawna Orleans at bedside. Patient is sound asleep.  As per Shawna Orleans, patient is seen by Dr. Wilson Singer for the patient's UTI and kidney stones. Patient is currently living at [facility] but was living at [facility].  As per patient's chart, patient's pcp at [facility] place is Dr. Frederik Pear, system updated.  Shawna Orleans reports she is the patient's medical POA.  No further CM needs at this time.

## 2013-02-27 ENCOUNTER — Encounter (HOSPITAL_COMMUNITY): Payer: Self-pay | Admitting: Internal Medicine

## 2013-02-27 DIAGNOSIS — N179 Acute kidney failure, unspecified: Secondary | ICD-10-CM

## 2013-02-27 DIAGNOSIS — G9340 Encephalopathy, unspecified: Secondary | ICD-10-CM

## 2013-02-27 DIAGNOSIS — N390 Urinary tract infection, site not specified: Secondary | ICD-10-CM | POA: Diagnosis present

## 2013-02-27 DIAGNOSIS — R509 Fever, unspecified: Secondary | ICD-10-CM | POA: Insufficient documentation

## 2013-02-27 DIAGNOSIS — A419 Sepsis, unspecified organism: Principal | ICD-10-CM | POA: Diagnosis present

## 2013-02-27 DIAGNOSIS — J189 Pneumonia, unspecified organism: Secondary | ICD-10-CM

## 2013-02-27 DIAGNOSIS — Z515 Encounter for palliative care: Secondary | ICD-10-CM

## 2013-02-27 LAB — COMPREHENSIVE METABOLIC PANEL
ALT: 19 U/L (ref 0–53)
AST: 23 U/L (ref 0–37)
Albumin: 3.1 g/dL — ABNORMAL LOW (ref 3.5–5.2)
Albumin: 2.4 g/dL — ABNORMAL LOW (ref 3.5–5.2)
Alkaline Phosphatase: 115 U/L (ref 39–117)
BUN: 24 mg/dL — ABNORMAL HIGH (ref 6–23)
CO2: 24 mEq/L (ref 19–32)
Calcium: 8.9 mg/dL (ref 8.4–10.5)
Calcium: 7.9 mg/dL — ABNORMAL LOW (ref 8.4–10.5)
Chloride: 103 mEq/L (ref 96–112)
Creatinine, Ser: 1.39 mg/dL — ABNORMAL HIGH (ref 0.50–1.35)
Creatinine, Ser: 1.55 mg/dL — ABNORMAL HIGH (ref 0.50–1.35)
GFR calc Af Amer: 52 mL/min — ABNORMAL LOW (ref >90)
GFR calc Af Amer: 45 mL/min — ABNORMAL LOW (ref >90)
GFR calc non Af Amer: 39 mL/min — ABNORMAL LOW (ref >90)
Glucose, Bld: 117 mg/dL — ABNORMAL HIGH (ref 70–99)
Potassium: 4.2 mEq/L (ref 3.5–5.1)
Potassium: 3.9 mEq/L (ref 3.5–5.1)
Sodium: 142 mEq/L (ref 135–145)
Total Protein: 6.6 g/dL (ref 6.0–8.3)

## 2013-02-27 LAB — URINALYSIS, ROUTINE W REFLEX MICROSCOPIC
Glucose, UA: NEGATIVE mg/dL
Specific Gravity, Urine: 1.026 (ref 1.005–1.030)
Urobilinogen, UA: 1 mg/dL (ref 0.0–1.0)
pH: 5.5 (ref 5.0–8.0)

## 2013-02-27 LAB — URINE MICROSCOPIC-ADD ON

## 2013-02-27 LAB — CBC WITH DIFFERENTIAL/PLATELET
Basophils Relative: 0 % (ref 0–1)
Eosinophils Absolute: 0.1 10*3/uL (ref 0.0–0.7)
HCT: 36.2 % — ABNORMAL LOW (ref 39.0–52.0)
Hemoglobin: 11.7 g/dL — ABNORMAL LOW (ref 13.0–17.0)
Lymphs Abs: 0.4 10*3/uL — ABNORMAL LOW (ref 0.7–4.0)
MCH: 32.3 pg (ref 26.0–34.0)
MCHC: 32.3 g/dL (ref 30.0–36.0)
MCV: 100 fL (ref 78.0–100.0)
Monocytes Absolute: 0.5 10*3/uL (ref 0.1–1.0)
Neutro Abs: 12.5 10*3/uL — ABNORMAL HIGH (ref 1.7–7.7)
Neutrophils Relative %: 92 % — ABNORMAL HIGH (ref 43–77)

## 2013-02-27 LAB — MRSA PCR SCREENING: MRSA by PCR: POSITIVE — AB

## 2013-02-27 LAB — LACTIC ACID, PLASMA: Lactic Acid, Venous: 2.3 mmol/L — ABNORMAL HIGH (ref 0.5–2.2)

## 2013-02-27 LAB — INFLUENZA PANEL BY PCR (TYPE A & B): H1N1 flu by pcr: NOT DETECTED

## 2013-02-27 MED ORDER — ONDANSETRON HCL 4 MG PO TABS: 4.0000 mg | ORAL_TABLET | Freq: Four times a day (QID) | ORAL | Status: DC | PRN

## 2013-02-27 MED ORDER — SODIUM CHLORIDE 0.9 % IV BOLUS (SEPSIS)
500.0000 mL | Freq: Once | INTRAVENOUS | Status: AC
  Administered 2013-02-27: 500 mL via INTRAVENOUS

## 2013-02-27 MED ORDER — IPRATROPIUM BROMIDE 0.02 % IN SOLN
0.5000 mg | Freq: Four times a day (QID) | RESPIRATORY_TRACT | Status: DC
  Administered 2013-02-27 – 2013-03-01 (×11): 0.5 mg via RESPIRATORY_TRACT
  Filled 2013-02-27 (×10): qty 2.5

## 2013-02-27 MED ORDER — ACETAMINOPHEN 325 MG PO TABS: 650.0000 mg | ORAL_TABLET | Freq: Four times a day (QID) | ORAL | Status: DC | PRN

## 2013-02-27 MED ORDER — DEXTROSE 5 % IV SOLN
1.0000 g | INTRAVENOUS | Status: DC
  Administered 2013-02-27 – 2013-02-28 (×2): 1 g via INTRAVENOUS
  Filled 2013-02-27 (×3): qty 1

## 2013-02-27 MED ORDER — ALBUTEROL SULFATE (5 MG/ML) 0.5% IN NEBU
2.5000 mg | INHALATION_SOLUTION | Freq: Once | RESPIRATORY_TRACT | Status: AC
  Administered 2013-02-27: 2.5 mg via RESPIRATORY_TRACT
  Filled 2013-02-27: qty 0.5

## 2013-02-27 MED ORDER — ALBUTEROL SULFATE (2.5 MG/3ML) 0.083% IN NEBU
2.5000 mg | INHALATION_SOLUTION | RESPIRATORY_TRACT | Status: DC | PRN
  Filled 2013-02-27: qty 3

## 2013-02-27 MED ORDER — CHLORHEXIDINE GLUCONATE CLOTH 2 % EX PADS
6.0000 | MEDICATED_PAD | Freq: Every day | CUTANEOUS | Status: DC
  Administered 2013-02-28 – 2013-03-01 (×2): 6 via TOPICAL

## 2013-02-27 MED ORDER — ACETAMINOPHEN 650 MG RE SUPP: 650.0000 mg | Freq: Four times a day (QID) | RECTAL | Status: DC | PRN

## 2013-02-27 MED ORDER — MORPHINE SULFATE 2 MG/ML IJ SOLN
INTRAMUSCULAR | Status: AC
  Administered 2013-02-27: 1 mg via INTRAVENOUS
  Filled 2013-02-27: qty 1

## 2013-02-27 MED ORDER — MUPIROCIN 2 % EX OINT
1.0000 "application " | TOPICAL_OINTMENT | Freq: Two times a day (BID) | CUTANEOUS | Status: DC
  Administered 2013-02-27 – 2013-03-01 (×5): 1 via NASAL
  Filled 2013-02-27 (×2): qty 22

## 2013-02-27 MED ORDER — VANCOMYCIN HCL IN DEXTROSE 750-5 MG/150ML-% IV SOLN
750.0000 mg | Freq: Two times a day (BID) | INTRAVENOUS | Status: DC
  Administered 2013-02-27: 750 mg via INTRAVENOUS
  Filled 2013-02-27: qty 150

## 2013-02-27 MED ORDER — ERYTHROMYCIN 5 MG/GM OP OINT
1.0000 "application " | TOPICAL_OINTMENT | Freq: Four times a day (QID) | OPHTHALMIC | Status: DC
  Administered 2013-02-27 – 2013-03-01 (×10): 1 via OPHTHALMIC
  Filled 2013-02-27: qty 3.5

## 2013-02-27 MED ORDER — ALBUTEROL SULFATE (5 MG/ML) 0.5% IN NEBU
2.5000 mg | INHALATION_SOLUTION | Freq: Four times a day (QID) | RESPIRATORY_TRACT | Status: DC
  Administered 2013-02-27 – 2013-03-01 (×11): 2.5 mg via RESPIRATORY_TRACT
  Filled 2013-02-27 (×10): qty 0.5

## 2013-02-27 MED ORDER — PIPERACILLIN-TAZOBACTAM 3.375 G IVPB
3.3750 g | Freq: Three times a day (TID) | INTRAVENOUS | Status: DC
  Filled 2013-02-27 (×2): qty 50

## 2013-02-27 MED ORDER — ONDANSETRON HCL 4 MG/2ML IJ SOLN: 4.0000 mg | Freq: Four times a day (QID) | INTRAMUSCULAR | Status: DC | PRN

## 2013-02-27 MED ORDER — MORPHINE SULFATE 2 MG/ML IJ SOLN
1.0000 mg | INTRAMUSCULAR | Status: DC | PRN
  Administered 2013-02-27 – 2013-02-28 (×4): 1 mg via INTRAVENOUS
  Filled 2013-02-27 (×5): qty 1

## 2013-02-27 MED ORDER — VANCOMYCIN HCL IN DEXTROSE 1-5 GM/200ML-% IV SOLN
1000.0000 mg | INTRAVENOUS | Status: DC
  Administered 2013-02-28 – 2013-03-01 (×2): 1000 mg via INTRAVENOUS
  Filled 2013-02-27 (×2): qty 200

## 2013-02-27 MED ORDER — SODIUM CHLORIDE 0.9 % IV SOLN
INTRAVENOUS | Status: AC
  Administered 2013-02-27 (×2): via INTRAVENOUS

## 2013-02-27 MED ORDER — SODIUM CHLORIDE 0.9 % IV SOLN
INTRAVENOUS | Status: DC
  Administered 2013-02-27: 100 mL/h via INTRAVENOUS

## 2013-02-27 MED ORDER — SODIUM CHLORIDE 0.9 % IJ SOLN
3.0000 mL | Freq: Two times a day (BID) | INTRAMUSCULAR | Status: DC
  Administered 2013-02-27 – 2013-03-02 (×5): 3 mL via INTRAVENOUS

## 2013-02-27 MED ORDER — DEXTROSE 5 % IV SOLN: 2.0000 g | INTRAVENOUS | Status: DC

## 2013-02-27 MED ORDER — ENOXAPARIN SODIUM 40 MG/0.4ML ~~LOC~~ SOLN
40.0000 mg | SUBCUTANEOUS | Status: DC
  Administered 2013-02-27 – 2013-03-01 (×3): 40 mg via SUBCUTANEOUS
  Filled 2013-02-27 (×3): qty 0.4

## 2013-02-27 NOTE — Consult Note (Signed)
Name: Ruben Morrison MRN: [id number] DOB: 07/26/1925    ADMISSION DATE:  02/26/2013 CONSULTATION DATE:  02/27/2013   REFERRING MD :  [facility] PRIMARY SERVICE: [facility]  CHIEF COMPLAINT:   Shock, resp failure  BRIEF PATIENT DESCRIPTION:  79 y.o.M with PNA and UTI and septic shock.  Pt is DNI DNR confirmed with daughter.  SIGNIFICANT EVENTS / STUDIES:    LINES / TUBES: PIV  CULTURES: BC UC  ANTIBIOTICS: Cefepime  vanco  HISTORY OF PRESENT ILLNESS:   79yo M with PNA and UTI and septic shock   Pt in MSOF. Pt is progressively less responsive.  ELink care only to this point.  I was asked to speak to daughter again and to look in on this pt  PMHx as below  PAST MEDICAL HISTORY :  Past Medical History  Diagnosis Date  . Dementia   . Renal disorder   . Pneumococcal pneumonia   . Hypertension   . Encephalopathy acute   . Prostate cancer   . Coronary artery disease    Past Surgical History  Procedure Laterality Date  . Cardiac surgery    . Insertion prostate radiation seed    . Inguinal hernia repair    . Eye surgery      cataract  . Coronary artery bypass graft  1996    [place], [place]   Prior to Admission medications   Medication Sig Start Date End Date Taking? Authorizing Provider  acetaminophen (TYLENOL) 650 MG suppository Place 650 mg rectally every 4 (four) hours as needed for moderate pain or fever.   Yes Historical Provider, MD  albuterol (PROVENTIL) (2.5 MG/3ML) 0.083% nebulizer solution Take 2.5 mg by nebulization every 4 (four) hours as needed for wheezing or shortness of breath.   Yes Historical Provider, MD  aspirin 81 MG chewable tablet Chew 81 mg by mouth every morning.    Yes Historical Provider, MD  atorvastatin (LIPITOR) 20 MG tablet Take 20 mg by mouth at bedtime.    Yes Historical Provider, MD  calcium carbonate (TUMS - DOSED IN MG ELEMENTAL CALCIUM) 500 MG chewable tablet Chew 1 tablet by mouth 2 (two) times daily.   Yes Historical Provider, MD    citalopram (CELEXA) 10 MG tablet Take 15 mg by mouth every morning.    Yes Historical Provider, MD  erythromycin ophthalmic ointment Place 1 application into both eyes 4 (four) times daily.   Yes Historical Provider, MD  mirtazapine (REMERON) 15 MG tablet Take 15 mg by mouth at bedtime.   Yes Historical Provider, MD  Multiple Vitamins-Minerals (CEROVITE SENIOR PO) Take 1 capsule by mouth 2 (two) times daily.   Yes Historical Provider, MD  oxyCODONE (OXY IR/ROXICODONE) 5 MG immediate release tablet Take 1 tablet (5 mg total) by mouth every 6 (six) hours as needed for moderate pain. 02/03/13  Yes Hollice Espy, MD  polyethylene glycol (MIRALAX / GLYCOLAX) packet Take 17 g by mouth daily as needed for moderate constipation.    Yes Historical Provider, MD  Tamsulosin HCl (FLOMAX) 0.4 MG CAPS Take 0.4 mg by mouth every morning.    Yes Historical Provider, MD  tiotropium (SPIRIVA) 18 MCG inhalation capsule Place 18 mcg into inhaler and inhale every morning.    Yes Historical Provider, MD  trimethoprim (TRIMPEX) 100 MG tablet Take 1 tablet (100 mg total) by mouth daily. 01/07/13  Yes [name], MD   Allergies  Allergen Reactions  . Avelox [Moxifloxacin]     Reaction:Unknown Per medication  MAR  . Ciprofloxacin     Unknown reaction- per MAR  . Floxin [Ofloxacin]     Unknown reaction- per MAR    FAMILY HISTORY:  History reviewed. No pertinent family history. SOCIAL HISTORY:  reports that he has never smoked. He has never used smokeless tobacco. He reports that he does not drink alcohol or use illicit drugs.  REVIEW OF SYSTEMS:  Not obtainable  SUBJECTIVE:   VITAL SIGNS: Temp:  [97.9 F (36.6 C)-102 F (38.9 C)] 97.9 F (36.6 C) (12/27 0400) Pulse Rate:  [58-105] 86 (12/27 0500) Resp:  [20-44] 44 (12/27 0500) BP: (66-116)/(28-60) 72/38 mmHg (12/27 0500) SpO2:  [90 %-99 %] 99 % (12/27 0500) Weight:  [73.3 kg (161 lb 9.6 oz)] 73.3 kg (161 lb 9.6 oz) (12/27 0256) HEMODYNAMICS:    VENTILATOR SETTINGS:   INTAKE / OUTPUT: Intake/Output     12/26 0701 - 12/27 0700 12/27 0701 - 12/28 0700   I.V. (mL/kg) 210 (2.9)    Other 60    IV Piggyback 750    Total Intake(mL/kg) 1020 (13.9)    Net +1020          Stool Occurrence 1 x      PHYSICAL EXAMINATION: General:  Min responsive Neuro:  nonfocal HEENT:  Dry mucus membranes Cardiovascular:  RRR nl CV exam, except poor perfusion  Lungs:  Rales diffuse Abdomen:  Some tenderness Musculoskeletal:  from Skin:  clear  LABS:  CBC  Recent Labs Lab 02/26/13 2302 02/27/13 0526  WBC 15.6* 13.5*  HGB 13.5 11.7*  HCT 40.6 36.2*  PLT 219 172   Coag's No results found for this basename: APTT, INR,  in the last 168 hours BMET  Recent Labs Lab 02/26/13 2302 02/27/13 0526  NA 141 142  K 4.2 3.9  CL 103 108  CO2 24 24  BUN 24* 25*  CREATININE 1.39* 1.55*  GLUCOSE 117* 126*   Electrolytes  Recent Labs Lab 02/26/13 2302 02/27/13 0526  CALCIUM 8.9 7.9*   Sepsis Markers  Recent Labs Lab 02/26/13 2327 02/27/13 0526  LATICACIDVEN 4.70* 2.3*   ABG No results found for this basename: PHART, PCO2ART, PO2ART,  in the last 168 hours Liver Enzymes  Recent Labs Lab 02/26/13 2302 02/27/13 0526  AST 42* 23  ALT 25 19  ALKPHOS 115 93  BILITOT 0.7 0.6  ALBUMIN 3.1* 2.4*   Cardiac Enzymes No results found for this basename: TROPONINI, PROBNP,  in the last 168 hours Glucose No results found for this basename: GLUCAP,  in the last 168 hours  Imaging Dg Chest Portable 1 View  02/26/2013   CLINICAL DATA:  Altered mental status, cough and fever.  EXAM: PORTABLE CHEST - 1 VIEW  COMPARISON:  Chest radiograph performed 01/03/2013  FINDINGS: The lungs are hypoexpanded. Vascular congestion is seen, with bilateral patchy airspace opacities. This may reflect pneumonia or mild interstitial edema. No pleural effusion or pneumothorax is identified.  The cardiomediastinal silhouette is borderline enlarged. The  patient is status post median sternotomy, with evidence of prior CABG. No acute osseous abnormalities are seen.  IMPRESSION: Lungs hypoexpanded. Vascular congestion and borderline cardiomegaly, with patchy bilateral airspace opacities. This may reflect pneumonia or mild interstitial edema.   Electronically Signed   By: Roanna Raider M.D.   On: 02/26/2013 22:42     CXR: patchy bilat pna  ASSESSMENT / PLAN:  PULMONARY A:PNA CAP NOS P:   No intubation Oxygen Comfort care  CARDIOVASCULAR A: septic shock, pulm edema P:  No pressors Reduce ivf with pulm edema  RENAL A:  AKI P:   monitor  GASTROINTESTINAL A:  ileus P:   npo  HEMATOLOGIC A:  leukocytosis P:  monitor  INFECTIOUS A:  Septic shock d/t pna/uti P:   Agree with current ABX  ENDOCRINE A:  Per primary team   P:     NEUROLOGIC A:  AMS d/t sepsis P:   monitor  TODAY'S SUMMARY: 79 y.o.M with PNA, resp failure, UTI, septic shock , MSOF.  Pt is confirmed DNI DNR.  No plans for intubation or CVL or pressors. Need to focus on comfort care but ok to cont ABX Pccm will see again prn   I have personally obtained a history, examined the patient, evaluated laboratory and imaging results, formulated the assessment and plan and placed orders.  Dorcas Carrow Beeper  [phone]  Cell  [phone]  If no response or cell goes to voicemail, call beeper [phone]  Pulmonary and Critical Care Medicine [facility] Pager: [phone]  02/27/2013, 7:45 AM

## 2013-02-27 NOTE — Progress Notes (Signed)
ANTIBIOTIC CONSULT NOTE - FOLLOW UP  Pharmacy Consult for Vancomycin, Cefepime Indication: possible HCAP, UTI  Allergies  Allergen Reactions  . Avelox [Moxifloxacin]     Reaction:Unknown Per medication MAR  . Ciprofloxacin     Unknown reaction- per MAR  . Floxin [Ofloxacin]     Unknown reaction- per [facility]    Patient Measurements: Height: 5\' 10"  (177.8 cm) Weight: 161 lb 9.6 oz (73.3 kg) IBW/kg (Calculated) : 73  Recent Labs  02/26/13 2302 02/27/13 0526  WBC 15.6* 13.5*  HGB 13.5 11.7*  PLT 219 172  CREATININE 1.39* 1.55*    Assessment: 87 yoM from NH presented to ER 12/26 with fever (Tm 103) and AMS concerning for possible PNA or UTI given productive cough and indwelling foley. CXR with patchy bilateral airspace opacities which may reflect PNA or mild intersitial edema, UA c/w with infection.  Vanc and Cefepime started. Pt with h/o CKD.   12/26 >> vancomycin >> 12/26 >> cefepime >>   Tmax: 102 WBCs: improve to 13.5K Renal: Scr 1.55, CG 36, N 35  12/26 blood x 2 >> pending 12/26 urine >> pending 12/27 influenza panel >> pending 12/27 mrsa pcr >> positive   Today is D#2 Vancomycin and Cefepime. Lactic acid, fever and WBC improving. Scr up slightly. Pt is a DNR, focusing on comfort care but ok to continue abx per CCM.   Goal of Therapy:  Vancomycin trough level 15-20 mcg/ml Appropriate renal dosing of antibiotics Eradication of infection  Plan:   Change Vancomycin to 1g IV q24h  Change Cefepime to 1g IV q24h  Pharmacy will f/u  Geoffry Paradise, PharmD, BCPS Pager: [phone] 10:11 AM Pharmacy #: [id number]

## 2013-02-27 NOTE — ED Provider Notes (Signed)
Medical screening examination/treatment/procedure(s) were conducted as a shared visit with non-physician practitioner(s) and myself.  I personally evaluated the patient during the encounter. Pt from SNF w/ fever, cough, dec LOC.  On PE, pt is ill appearing, will not follow commands, but is currently protecting airway. Pt started on broad spectrum abx for likely multifocal pna & UTI.  Admitted to [facility].   EKG Interpretation    Date/Time:    Ventricular Rate:    PR Interval:    QRS Duration:   QT Interval:    QTC Calculation:   R Axis:     Text Interpretation:                Shanna Cisco, MD 02/27/13 1104

## 2013-02-27 NOTE — Progress Notes (Signed)
Patient seen and examined. Discussed with daughter Lorina Rabon at bedside. He presents with evidence of a PNA and UTI and septic shock. Patient is a DNR/DNI and would not wish to be on pressors. This has been confirmed with daughter today. CCM has also had this talk with the daughter today. Plan is to continue abx for 48 hours, and if no improvement then consider full comfort care. He is Influenza A +, however is not alert enough to take PO tamiflu. Will keep in SDU today. Will continue to follow closely.  Peggye Pitt, MD [facility] Pager: [phone]

## 2013-02-27 NOTE — Progress Notes (Signed)
ANTIBIOTIC CONSULT NOTE - INITIAL  Pharmacy Consult for Vancomycin, cefepime Indication: PNA/UTI/sepsis   Allergies  Allergen Reactions  . Avelox [Moxifloxacin]     Reaction:Unknown Per medication MAR  . Ciprofloxacin     Unknown reaction- per MAR  . Floxin [Ofloxacin]     Unknown reaction- per [facility]    Patient Measurements: Height: 5\' 10"  (177.8 cm) Weight: 161 lb 9.6 oz (73.3 kg) IBW/kg (Calculated) : 73 Adjusted Body Weight:   Vital Signs: Temp: 97.9 F (36.6 C) (12/27 0400) Temp src: Axillary (12/27 0400) BP: 96/39 mmHg (12/27 0430) Pulse Rate: 83 (12/27 0430) Intake/Output from previous day: 12/26 0701 - 12/27 0700 In: 611.7 [I.V.:111.7; IV Piggyback:500] Out: -  Intake/Output from this shift: Total I/O In: 611.7 [I.V.:111.7; IV Piggyback:500] Out: -   Labs:  Recent Labs  02/26/13 2302  WBC 15.6*  HGB 13.5  PLT 219  CREATININE 1.39*   Estimated Creatinine Clearance: 40.1 ml/min (by C-G formula based on Cr of 1.39). No results found for this basename: VANCOTROUGH, VANCOPEAK, VANCORANDOM, GENTTROUGH, GENTPEAK, GENTRANDOM, TOBRATROUGH, TOBRAPEAK, TOBRARND, AMIKACINPEAK, AMIKACINTROU, AMIKACIN,  in the last 72 hours   Microbiology: No results found for this or any previous visit (from the past 720 hour(s)).  Medical History: Past Medical History  Diagnosis Date  . Dementia   . Renal disorder   . Pneumococcal pneumonia   . Hypertension   . Encephalopathy acute   . Prostate cancer   . Coronary artery disease     Medications:  Anti-infectives   Start     Dose/Rate Route Frequency Ordered Stop   02/27/13 2200  ceFEPIme (MAXIPIME) 2 g in dextrose 5 % 50 mL IVPB     2 g 100 mL/hr over 30 Minutes Intravenous Every 24 hours 02/27/13 0502     02/27/13 1000  vancomycin (VANCOCIN) IVPB 750 mg/150 ml premix     750 mg 150 mL/hr over 60 Minutes Intravenous Every 12 hours 02/27/13 0501     02/27/13 0600  piperacillin-tazobactam (ZOSYN) IVPB 3.375 g  Status:   Discontinued     3.375 g 12.5 mL/hr over 240 Minutes Intravenous 3 times per day 02/27/13 0501 02/27/13 0501   02/26/13 2145  ceFEPIme (MAXIPIME) 2 g in dextrose 5 % 50 mL IVPB     2 g 100 mL/hr over 30 Minutes Intravenous  Once 02/26/13 2135 02/27/13 0130   02/26/13 2145  vancomycin (VANCOCIN) IVPB 1000 mg/200 mL premix     1,000 mg 200 mL/hr over 60 Minutes Intravenous  Once 02/26/13 2135 02/27/13 0124     Assessment: Patient with PNA/UTI/sepsis.  First dose of antibiotics already given   Goal of Therapy:  Vancomycin trough level 15-20 mcg/ml Cefepime dosed based on patient weight and renal function   Plan:  Measure antibiotic drug levels at steady state Follow up culture results Vancomycin 750mg  iv q12hr Cefepime 2gm iv q24hr  Aleene Davidson Crowford 02/27/2013,5:03 AM

## 2013-02-27 NOTE — Progress Notes (Signed)
Pt with b/p of 77 systolic. MD informed. Bolus ordered and started

## 2013-02-27 NOTE — Progress Notes (Signed)
Spoke with RN and critical care and informed of pt's b/p and temp. Stated she would inform the MD about pt's status. Will continue to monitor.

## 2013-02-27 NOTE — Progress Notes (Signed)
Chaplain paged at family request at 35. Chaplain arrived at 0808  Ruben Ruben Morrison is being hospitalized and physical indicators point to his being in the process of dying. His primary care giver is his daughter. Another daughter, Ruben Morrison, is also in attendance.   Grief counsel and spiritual comfort provided. The chaplain actively listened as family related the medical history of Ruben Morrison, and the difficulties of taking care of him, given his dementia.  Family is prepared for his death, if it happens. They affirm the decision for DNI/DNR as being consistent with his wishes when he was not stricken with dementia.  Another daughter is taking care of Ruben Morrison's wife (all three daughter's mother). She too requires constant care giving.  REFERRAL/CONSULT:  The family wishes to talk about Palliative Care, Comfort Care, a Hospice referral. This includes a request to talk about grief support groups and support groups for care givers. The family is in the information gathering stage at present, but wishes information to better assist them making important decisions.  REQUEST: a member of the Palliative Care Team meet with family as possible.  Please page Chaplain upon family request or as needed. Chaplain follow up indicated  Leonette Most D. Ayvion Kavanagh, DMin Chaplain

## 2013-02-27 NOTE — Progress Notes (Addendum)
eLink Physician-Brief Progress Note Patient Name: Damascus Feldpausch DOB: Jul 03, 1925 MRN: [id number]  Date of Service  02/27/2013   HPI/Events of Note   Severe sepsis with high lactate, low BP  eICU Interventions  Ns bolus Rpt lactate in am Abx to cover UTI & HCAP Rpt lactate for clearance   Intervention Category Major Interventions: Sepsis - evaluation and management  Lyah Millirons V. 02/27/2013, 3:06 AM

## 2013-02-27 NOTE — ED Notes (Signed)
MD at bedside. 

## 2013-02-27 NOTE — Progress Notes (Signed)
Follow up on rounding. Daughter Shawna Orleans, who is the primary caregiver has gone home with flu-like symptoms. She had been aware and active since late yesterday evening and is exhausted. Daughter Sherrilyn Rist is present and sitting with Mr Damore. Sherrilyn Rist wishes nurse to come briefly and explain the monitor (what line means what). She is sitting outside the room watching through the door because she does not want to catch any flu that may be present in the room -even fully gowned/gloved and masked.  Please page chaplain for any further spiritual support. Chaplain follow up during rounding will likely be the next time spiritual care is provided.  Benjie Karvonen. Kattie Santoyo, DMin Chaplain

## 2013-02-27 NOTE — H&P (Signed)
[name] History and Physical  Ruben Morrison RUE:[id number] DOB: 28-Dec-1925 DOA: 02/26/2013  Referring physician: ER physician. PCP: Kimber Relic, MD   Chief Complaint: Fever and confusion.  HPI: Ruben Morrison is a 79 y.o. male with history of dementia, CAD, urethral strictures and recurrent UTI on chronic suppressive therapy, history of renal stones was brought to the ER because of fever and confusion. As per patient's daughter who provided most of the history patient had some difficulty swallowing night before this. Since yesterday morning patient has become more confused and has been having productive cough. Patient was found to have fever of 103F. Patient was brought to the ER. In the ER patient x-ray shows possible pneumonia and UA shows features of UTI. Patient has been started vancomycin and cefepime and admitted for further management. As per patient's daughter speech therapist has stated that patient's swallowing difficulties due to patient's fever. Patient to 3 days ago had gone to her urologist and at that time patient was felt to be dehydrated. Patient otherwise did not have any nausea vomiting or complain of any chest pain or shortness of breath abdominal pain diarrhea.   Review of Systems: As presented in the history of presenting illness, rest negative.  Past Medical History  Diagnosis Date  . Dementia   . Renal disorder   . Pneumococcal pneumonia   . Hypertension   . Encephalopathy acute   . Prostate cancer   . Coronary artery disease    Past Surgical History  Procedure Laterality Date  . Cardiac surgery    . Insertion prostate radiation seed    . Inguinal hernia repair    . Eye surgery      cataract  . Coronary artery bypass graft  1996    [place], [place]   Social History:  reports that he has never smoked. He has never used smokeless tobacco. He reports that he does not drink alcohol or use illicit drugs. Where does patient live nursing home. Can  patient participate in ADLs? No.  Allergies  Allergen Reactions  . Avelox [Moxifloxacin]     Reaction:Unknown Per medication MAR  . Ciprofloxacin     Unknown reaction- per MAR  . Floxin [Ofloxacin]     Unknown reaction- per [facility]    Family History: History reviewed. No pertinent family history.    Prior to Admission medications   Medication Sig Start Date End Date Taking? Authorizing Provider  acetaminophen (TYLENOL) 650 MG suppository Place 650 mg rectally every 4 (four) hours as needed for moderate pain or fever.   Yes Historical Provider, MD  albuterol (PROVENTIL) (2.5 MG/3ML) 0.083% nebulizer solution Take 2.5 mg by nebulization every 4 (four) hours as needed for wheezing or shortness of breath.   Yes Historical Provider, MD  aspirin 81 MG chewable tablet Chew 81 mg by mouth every morning.    Yes Historical Provider, MD  atorvastatin (LIPITOR) 20 MG tablet Take 20 mg by mouth at bedtime.    Yes Historical Provider, MD  calcium carbonate (TUMS - DOSED IN MG ELEMENTAL CALCIUM) 500 MG chewable tablet Chew 1 tablet by mouth 2 (two) times daily.   Yes Historical Provider, MD  citalopram (CELEXA) 10 MG tablet Take 15 mg by mouth every morning.    Yes Historical Provider, MD  erythromycin ophthalmic ointment Place 1 application into both eyes 4 (four) times daily.   Yes Historical Provider, MD  mirtazapine (REMERON) 15 MG tablet Take 15 mg by mouth at bedtime.   Yes Historical  Provider, MD  Multiple Vitamins-Minerals (CEROVITE SENIOR PO) Take 1 capsule by mouth 2 (two) times daily.   Yes Historical Provider, MD  oxyCODONE (OXY IR/ROXICODONE) 5 MG immediate release tablet Take 1 tablet (5 mg total) by mouth every 6 (six) hours as needed for moderate pain. 02/03/13  Yes Hollice Espy, MD  polyethylene glycol (MIRALAX / GLYCOLAX) packet Take 17 g by mouth daily as needed for moderate constipation.    Yes Historical Provider, MD  Tamsulosin HCl (FLOMAX) 0.4 MG CAPS Take 0.4 mg by mouth every  morning.    Yes Historical Provider, MD  tiotropium (SPIRIVA) 18 MCG inhalation capsule Place 18 mcg into inhaler and inhale every morning.    Yes Historical Provider, MD  trimethoprim (TRIMPEX) 100 MG tablet Take 1 tablet (100 mg total) by mouth daily. 01/07/13  Yes [name], MD    Physical Exam: Filed Vitals:   02/27/13 0030 02/27/13 0048 02/27/13 0102 02/27/13 0115  BP:  104/58 105/53   Pulse: 102 102 104 102  Temp:   101.6 F (38.7 C)   TempSrc:   Rectal   Resp: 29 26 22 27   SpO2: 95% 94% 95% 93%     General:  Well developed and nourished.  Eyes: Anicteric no pallor.  ENT: No discharge from ears eyes nose mouth.  Neck: No mass felt.  Cardiovascular: S1-S2 heard.  Respiratory: No rhonchi or crepitations.  Abdomen: Soft nontender bowel sounds present.  Skin: No rash.  Musculoskeletal: No edema.  Psychiatric: Patient is confused.   Neurologic: Patient is confused and has myoclonic jerks.  Labs on Admission:  Basic Metabolic Panel:  Recent Labs Lab 02/26/13 2302  NA 141  K 4.2  CL 103  CO2 24  GLUCOSE 117*  BUN 24*  CREATININE 1.39*  CALCIUM 8.9   Liver Function Tests:  Recent Labs Lab 02/26/13 2302  AST 42*  ALT 25  ALKPHOS 115  BILITOT 0.7  PROT 6.6  ALBUMIN 3.1*   No results found for this basename: LIPASE, AMYLASE,  in the last 168 hours No results found for this basename: AMMONIA,  in the last 168 hours CBC:  Recent Labs Lab 02/26/13 2302  WBC 15.6*  NEUTROABS 14.7*  HGB 13.5  HCT 40.6  MCV 100.0  PLT 219   Cardiac Enzymes: No results found for this basename: CKTOTAL, CKMB, CKMBINDEX, TROPONINI,  in the last 168 hours  BNP (last 3 results) No results found for this basename: PROBNP,  in the last 8760 hours CBG: No results found for this basename: GLUCAP,  in the last 168 hours  Radiological Exams on Admission: Dg Chest Portable 1 View  02/26/2013   CLINICAL DATA:  Altered mental status, cough and fever.  EXAM:  PORTABLE CHEST - 1 VIEW  COMPARISON:  Chest radiograph performed 01/03/2013  FINDINGS: The lungs are hypoexpanded. Vascular congestion is seen, with bilateral patchy airspace opacities. This may reflect pneumonia or mild interstitial edema. No pleural effusion or pneumothorax is identified.  The cardiomediastinal silhouette is borderline enlarged. The patient is status post median sternotomy, with evidence of prior CABG. No acute osseous abnormalities are seen.  IMPRESSION: Lungs hypoexpanded. Vascular congestion and borderline cardiomegaly, with patchy bilateral airspace opacities. This may reflect pneumonia or mild interstitial edema.   Electronically Signed   By: Roanna Raider M.D.   On: 02/26/2013 22:42    Assessment/Plan Principal Problem:   Sepsis Active Problems:   Dementia   UTI (lower urinary tract infection)  Pneumonia   Acute encephalopathy   1. Sepsis probably from pneumonia and UTI - check influenza PCR. Follow blood cultures and urine cultures. Continue with vancomycin and cefepime for now. Continue with IV fluids. We'll keep patient n.p.o. until patient mental status improves. 2. Acute encephalopathy in a patient with dementia - from #1 reason. Continue to closely observe. I think patient's mental status will improve with improvement in his fever. 3. Chronic renal failure - closely observe intake output and metabolic panel. 4. History of urethral stricture on chronic indwelling catheter - check urine cultures. 5. History of CAD - has not had any chest pain. 6. History of leukemia per patient's daughter and at this time is being on observation.    Code Status: DO NOT RESUSCITATE.  Family Communication: Patient's daughter at the bedside.  Disposition Plan: Admit to inpatient.    [place] Espina N. [name] Pager [phone].  If 7PM-7AM, please contact night-coverage [email or web address] Password [email or web address] 02/27/2013, 1:51 AM

## 2013-02-27 NOTE — Progress Notes (Signed)
Pt with b/p of 72/38 and had a run of svt of >170 md called awaiting call back.

## 2013-02-28 DIAGNOSIS — A419 Sepsis, unspecified organism: Secondary | ICD-10-CM

## 2013-02-28 DIAGNOSIS — J111 Influenza due to unidentified influenza virus with other respiratory manifestations: Secondary | ICD-10-CM

## 2013-02-28 MED ORDER — OSELTAMIVIR PHOSPHATE 30 MG PO CAPS: 30.0000 mg | ORAL_CAPSULE | Freq: Two times a day (BID) | ORAL | Status: DC

## 2013-02-28 MED ORDER — OSELTAMIVIR PHOSPHATE 75 MG PO CAPS
75.0000 mg | ORAL_CAPSULE | Freq: Every day | ORAL | Status: DC
  Administered 2013-02-28 – 2013-03-03 (×4): 75 mg via ORAL
  Filled 2013-02-28 (×5): qty 1

## 2013-02-28 MED ORDER — SODIUM CHLORIDE 0.9 % IV SOLN
INTRAVENOUS | Status: DC
  Administered 2013-02-28 – 2013-03-01 (×3): via INTRAVENOUS

## 2013-02-28 NOTE — Progress Notes (Signed)
Follow up Chaplain Visit  This visit found Ruben Morrison aware and alert with the bounds of his dementia. She says he appreciates the care he is being given. He requests communion rites from an [facility] on Monday 29 December. This request will be forwarded to the Department of Spiritual Care and Wholeness.  Daughters are relieved at the turn of his health.  Benjie Karvonen. Ondrea Dow, DMin, MDiv Chaplain

## 2013-02-28 NOTE — Progress Notes (Signed)
[facility] PROGRESS NOTE  Ruben Morrison JXB:[id number] DOB: [birth year] DOA: 02/26/2013 PCP: Kimber Relic, MD  Assessment/Plan: Severe Sepsis -Present at the time of admission -Secondary to UTI and HCAP -Continue IV fluids HCAP -Continue vancomycin and cefepime -WBC is improving -Blood pressure although marginal is gradually improving UTI -Continue vancomycin and cefepime pending culture data Acute encephalopathy -Much improved today -Daughter at the bedside states that mentation is near baseline today Influenza -Start Tamiflu as the patient has multiple risk factors for complications CKD stage IV -Baseline creatinine 1.1-1.4 Ureteral stricture -Continue Foley catheter -Will not remove at this time Hx of CLL -monitor CBC CODE STATUS -DNR/DNI confirmed with daughter  Family Communication:   daughter at beside Disposition Plan:   Home when medically stable    Antibiotics:  Vancomycin 02/26/2013>>>  Cefepime 02/27/2013>>>  Tamiflu 02/28/2013>>>    Procedures/Studies: Dg Abd 1 View  02/02/2013   CLINICAL DATA:  Post left-sided ureteral ESWL  EXAM: ABDOMEN - 1 VIEW  COMPARISON:  Abdominal radiograph - 01/22/2013; 01/14/2013; CT abdomen pelvis -01/03/2013  FINDINGS: There are 2 discrete punctate opacities which overlie the expected location of the superior aspect of the left ureter with dominant opacity measuring approximately 0.6 x 0.4 cm an additional opacity measuring approximately 0.4 x 0.4 cm.  No additional opacities overlie the expected location of either renal fossa, the right ureter or the urinary bladder, though note, evaluation somewhat degraded secondary to overlying bowel gas and stool.  Multilevel lumbar spine DDD. Brachytherapy seeds overlie the lower pelvis.  IMPRESSION: Grossly unchanged size and location of suspected adjacent renal stones within the superior aspect of the left ureter. Further evaluation could be performed with abdominal CT as  clinically indicated.   Electronically Signed   By: Simonne Come M.D.   On: 02/02/2013 17:23   US Renal  01/31/2013   CLINICAL DATA:  Evaluate for hydronephrosis. Recent shock wave lithotripsy on the left. Possible UTI with mental status changes.  EXAM: RENAL/URINARY TRACT ULTRASOUND COMPLETE  COMPARISON:  Renal ultrasound 01/26/2013 and CT abdomen pelvis 01/03/2013  FINDINGS: Right Kidney:  Length: 10.1 cm. There is slight diffuse cortical thinning of the right kidney. Echogenicity of the cortex is slightly increased. Approximately two tiny echogenic areas are seen within the right renal collecting system, likely reflecting nonobstructing stones. No mass or hydronephrosis visualized.  Left Kidney:  Length: Measures 9.7 cm in sagittal length. There is slight thinning of the renal cortex. The cortex appears within normal limits for echogenicity. There is a stable upper pole central sinus cyst. Negative for hydronephrosis. 6 x 1 mm echogenic area within the midpole of left kidney may reflect a small stone fragment.  There is no evidence of ureteral dilatation.  Bladder:  The urinary bladder is moderately distended at the time imaging. There is a moderate amount of echogenic debris layering dependently within the bladder. Some of the echogenic areas are somewhat rounded and could be stone fragments or blood clots.  IMPRESSION: 1. Negative for hydronephrosis. 2. Moderate amount of debris layering within the urinary bladder. Some of the echogenic debris within the bladder somewhat rounded in could reflect stone fragments given history of recent lithotripsy, or blood clots. 3. Renal sinus cysts left kidney. 4. Suspect 1 or 2 small nonobstructing stones in both kidneys.   Electronically Signed   By: Britta Mccreedy M.D.   On: 01/31/2013 09:49   Dg Chest Portable 1 View  02/26/2013   CLINICAL DATA:  Altered mental status, cough and fever.  EXAM: PORTABLE CHEST - 1 VIEW  COMPARISON:  Chest radiograph performed  01/03/2013  FINDINGS: The lungs are hypoexpanded. Vascular congestion is seen, with bilateral patchy airspace opacities. This may reflect pneumonia or mild interstitial edema. No pleural effusion or pneumothorax is identified.  The cardiomediastinal silhouette is borderline enlarged. The patient is status post median sternotomy, with evidence of prior CABG. No acute osseous abnormalities are seen.  IMPRESSION: Lungs hypoexpanded. Vascular congestion and borderline cardiomegaly, with patchy bilateral airspace opacities. This may reflect pneumonia or mild interstitial edema.   Electronically Signed   By: Roanna Raider M.D.   On: 02/26/2013 22:42         Subjective: Patient denies fevers, chills, chest discomfort, shortness breath, nausea, vomiting, diarrhea, abdominal pain. He feels hungry. He wants to eat. No dysuria or hematuria.  Objective: Filed Vitals:   02/28/13 0600 02/28/13 0800 02/28/13 0941 02/28/13 1200  BP: 104/59     Pulse: 74     Temp:  97.5 F (36.4 C)  98.6 F (37 C)  TempSrc:  Axillary  Axillary  Resp: 13     Height:      Weight:      SpO2: 100%  98%     Intake/Output Summary (Last 24 hours) at 02/28/13 1307 Last data filed at 02/28/13 0500  Gross per 24 hour  Intake 1250.83 ml  Output    390 ml  Net 860.83 ml   Weight change: 0 kg (0 lb) Exam:   General:  Pt is alert, follows commands appropriately, not in acute distress  HEENT: No icterus, No thrush, [place]/AT  Cardiovascular: RRR, S1/S2, no rubs, no gallops  Respiratory: Bilateral scattered rales. No wheezing. Good air movement.  Abdomen: Soft/+BS, non tender, non distended, no guarding  Extremities: No edema, No lymphangitis, No petechiae, No rashes, no synovitis  Data Reviewed: Basic Metabolic Panel:  Recent Labs Lab 02/26/13 2302 02/27/13 0526  NA 141 142  K 4.2 3.9  CL 103 108  CO2 24 24  GLUCOSE 117* 126*  BUN 24* 25*  CREATININE 1.39* 1.55*  CALCIUM 8.9 7.9*   Liver Function  Tests:  Recent Labs Lab 02/26/13 2302 02/27/13 0526  AST 42* 23  ALT 25 19  ALKPHOS 115 93  BILITOT 0.7 0.6  PROT 6.6 4.7*  ALBUMIN 3.1* 2.4*   No results found for this basename: LIPASE, AMYLASE,  in the last 168 hours No results found for this basename: AMMONIA,  in the last 168 hours CBC:  Recent Labs Lab 02/26/13 2302 02/27/13 0526  WBC 15.6* 13.5*  NEUTROABS 14.7* 12.5*  HGB 13.5 11.7*  HCT 40.6 36.2*  MCV 100.0 100.0  PLT 219 172   Cardiac Enzymes: No results found for this basename: CKTOTAL, CKMB, CKMBINDEX, TROPONINI,  in the last 168 hours BNP: No components found with this basename: POCBNP,  CBG: No results found for this basename: GLUCAP,  in the last 168 hours  Recent Results (from the past 240 hour(s))  CULTURE, BLOOD (ROUTINE X 2)     Status: None   Collection Time    02/26/13 11:02 PM      Result Value Range Status   Specimen Description BLOOD LEFT FOREARM   Final   Special Requests BOTTLES DRAWN AEROBIC AND ANAEROBIC 4CC   Final   Culture  Setup Time     Final   Value: 02/27/2013 03:49     Performed at [facility]   Culture     Final  Value:        BLOOD CULTURE RECEIVED NO GROWTH TO DATE CULTURE WILL BE HELD FOR 5 DAYS BEFORE ISSUING A FINAL NEGATIVE REPORT     Performed at [facility]   Report Status PENDING   Incomplete  CULTURE, BLOOD (ROUTINE X 2)     Status: None   Collection Time    02/26/13 11:17 PM      Result Value Range Status   Specimen Description BLOOD LEFT HAND   Final   Special Requests BOTTLES DRAWN AEROBIC ONLY 3CC   Final   Culture  Setup Time     Final   Value: 02/27/2013 03:49     Performed at [facility]   Culture     Final   Value:        BLOOD CULTURE RECEIVED NO GROWTH TO DATE CULTURE WILL BE HELD FOR 5 DAYS BEFORE ISSUING A FINAL NEGATIVE REPORT     Performed at [facility]   Report Status PENDING   Incomplete  MRSA PCR SCREENING     Status: Abnormal   Collection Time     02/27/13  2:50 AM      Result Value Range Status   MRSA by PCR POSITIVE (*) NEGATIVE Final   Comment:            The GeneXpert MRSA Assay (FDA     approved for NASAL specimens     only), is one component of a     comprehensive MRSA colonization     surveillance program. It is not     intended to diagnose MRSA     infection nor to guide or     monitor treatment for     MRSA infections.     RESULT CALLED TO, READ BACK BY AND VERIFIED WITH:     MITCHELL,C RN @0508  ON 12.27.2014 BY MCREYNOLDS,B     Scheduled Meds: . albuterol  2.5 mg Nebulization Q6H  . ceFEPime (MAXIPIME) IV  1 g Intravenous Q24H  . Chlorhexidine Gluconate Cloth  6 each Topical Q0600  . enoxaparin (LOVENOX) injection  40 mg Subcutaneous Q24H  . erythromycin  1 application Both Eyes QID  . ipratropium  0.5 mg Nebulization Q6H  . mupirocin ointment  1 application Nasal BID  . sodium chloride  3 mL Intravenous Q12H  . vancomycin  1,000 mg Intravenous Q24H   Continuous Infusions:    Grisela Mesch, DO  [facility] Pager [phone]  If 7PM-7AM, please contact night-coverage [email or web address] Password [email or web address] 02/28/2013, 1:07 PM   LOS: 2 days

## 2013-03-01 LAB — CBC
HCT: 33.2 % — ABNORMAL LOW (ref 39.0–52.0)
MCHC: 33.1 g/dL (ref 30.0–36.0)
RBC: 3.4 MIL/uL — ABNORMAL LOW (ref 4.22–5.81)
RDW: 14 % (ref 11.5–15.5)
WBC: 8.2 10*3/uL (ref 4.0–10.5)

## 2013-03-01 LAB — BASIC METABOLIC PANEL
BUN: 20 mg/dL (ref 6–23)
GFR calc Af Amer: 65 mL/min — ABNORMAL LOW (ref >90)
GFR calc non Af Amer: 56 mL/min — ABNORMAL LOW (ref >90)
Glucose, Bld: 98 mg/dL (ref 70–99)
Potassium: 3.8 mEq/L (ref 3.5–5.1)

## 2013-03-01 LAB — URINE CULTURE: Colony Count: >=100000

## 2013-03-01 MED ORDER — MORPHINE SULFATE 2 MG/ML IJ SOLN: 2.0000 mg | INTRAMUSCULAR | Status: DC | PRN

## 2013-03-01 NOTE — Progress Notes (Signed)
CARE MANAGEMENT NOTE 03/01/2013  Patient:  ARVAL, BRANDSTETTER   Account Number:  [id number]  Date Initiated:  03/01/2013  Documentation initiated by:  DAVIS,RHONDA  Subjective/Objective Assessment:   pt from [facility] admitted with fever, + influenza  A,pna, history of urinary difficulties-has a chronic foley cath.     Action/Plan:   [facility].   Anticipated DC Date:  03/04/2013   Anticipated DC Plan:  SKILLED NURSING FACILITY  In-house referral  Clinical Social Worker      DC Planning Services  NA      [facility] Choice  NA   Choice offered to / List presented to:  NA   DME arranged  NA      DME agency  NA     HH arranged  NA      HH agency  NA   Status of service:  In process, will continue to follow Medicare Important Message given?  NA - LOS <3 / Initial given by admissions (If response is "NO", the following Medicare IM given date fields will be blank) Date Medicare IM given:   Date Additional Medicare IM given:    Discharge Disposition:    Per UR Regulation:  Reviewed for med. necessity/level of care/duration of stay  If discussed at Long Length of Stay Meetings, dates discussed:    Comments:  [id number]/Rhonda Stark Jock, BSN, [place] [phone] Chart Reviewed for discharge and hospital needs. Discharge needs at time of review:  None present will follow for needs. Review of patient progress due on [id number].

## 2013-03-01 NOTE — Consult Note (Signed)
[facility] Liaison: Received VM request from CSW [place] for family interest in [facility] at 4:30pm. Chart reviewed. Will follow up in am re availability. Thank you. Forrestine Him, LCSW [phone]

## 2013-03-01 NOTE — Progress Notes (Signed)
I had another face-to-face meeting with the patient's daughter at the bedside. She stated that she has spoken with the family and they have decided to transition the patient to a focus of comfort care. They desire transitioned to residential hospice, preferably [facility].  I have discontinued all blood draws, labs, radiographic studies, antibiotics, and IV fluids as per the family's wishes. The patient can eat via comfort feeding. The patient's morphine will be adjusted when necessary pain and respiratory distress. The patient will be transferred to a medical surgical floor. Social work will be consulted to facilitate transfer to residential hospice. I will continue Tamiflu at this time, while discontinuing all other antibiotics.  Onalee Hua Jed Kutch

## 2013-03-01 NOTE — Progress Notes (Signed)
Clinical Social Work Department BRIEF PSYCHOSOCIAL ASSESSMENT 03/01/2013  Patient:  Ruben Morrison, Ruben Morrison     Account Number:  [id number]     Admit date:  02/26/2013  Clinical Social Worker:  Orpah Greek  Date/Time:  03/01/2013 04:27 PM  Referred by:  Physician  Date Referred:  03/01/2013 Referred for  Residential hospice placement   Other Referral:   Interview type:  Family Other interview type:   patient's daughter, Amy at bedside    PSYCHOSOCIAL DATA Living Status:  WIFE Admitted from facility:   Level of care:   Primary support name:  Adriell Polansky (daughter) ph#: [phone] Primary support relationship to patient:  CHILD, ADULT Degree of support available:   good    CURRENT CONCERNS Current Concerns  Post-Acute Placement   Other Concerns:    SOCIAL WORK ASSESSMENT / PLAN CSW received call from [facility], Bjorn Loser that patient is for residential hospice & family is at bedside.   Assessment/plan status:  Information/Referral to [facility] Other assessment/ plan:   Information/referral to community resources:   CSW provided daughter with list of Residential Hospice Facilities - daughter is requesting Psychologist, sport and exercise (as other daughter, Shawna Orleans volunteers and is familiar with that facility), otherwise - [facility] or [facility] as backup facilities.    PATIENT'S/FAMILY'S RESPONSE TO PLAN OF CARE: CSW left message for Forrestine Him with the referral & will follow-up in the morning re: bed availability.    Patient's daughter states that family is all in agreement with choices for Residential Hospice Facilities.       Unice Bailey, LCSW [facility] Clinical Social Worker cell #: [phone]

## 2013-03-01 NOTE — Progress Notes (Signed)
[facility] PROGRESS NOTE  Ruben Morrison ZOX:[id number] DOB: 11/06/1925 DOA: 02/26/2013 PCP: Kimber Relic, MD  Assessment/Plan: Severe Sepsis  -Present at the time of admission  -Secondary to UTI and HCAP  -Continue IV fluids  -Although blood pressure remained soft, it is stable HCAP  -Continue vancomycin and cefepime  -WBC is improving  -Blood pressure although marginal is gradually improving  UTI--Pseudomonas -Continue vancomycin and cefepime pending culture data  Acute encephalopathy  -Less alert today, but still improved from the time of admission -I expect the patient to have waxing and waning mentation -Daughter at the bedside states that mentation overall still better than at the time of admission Influenza  -Start Tamiflu (02/28/2013) as the patient has multiple risk factors for complications  CKD stage IV  -Baseline creatinine 1.1-1.4  Ureteral stricture  -Continue Foley catheter  -Will not remove at this time  Hx of CLL  -monitor CBC  CODE STATUS  -DNR/DNI confirmed with daughter  -they are still contemplating full comfort care Family Communication: daughter at beside--  Disposition Plan: transfer to tele Antibiotics:  Vancomycin 02/26/2013>>>  Cefepime 02/27/2013>>>  Tamiflu 02/28/2013>>>          Procedures/Studies: Dg Abd 1 View  02/02/2013   CLINICAL DATA:  Post left-sided ureteral ESWL  EXAM: ABDOMEN - 1 VIEW  COMPARISON:  Abdominal radiograph - 01/22/2013; 01/14/2013; CT abdomen pelvis -01/03/2013  FINDINGS: There are 2 discrete punctate opacities which overlie the expected location of the superior aspect of the left ureter with dominant opacity measuring approximately 0.6 x 0.4 cm an additional opacity measuring approximately 0.4 x 0.4 cm.  No additional opacities overlie the expected location of either renal fossa, the right ureter or the urinary bladder, though note, evaluation somewhat degraded secondary to overlying bowel gas and  stool.  Multilevel lumbar spine DDD. Brachytherapy seeds overlie the lower pelvis.  IMPRESSION: Grossly unchanged size and location of suspected adjacent renal stones within the superior aspect of the left ureter. Further evaluation could be performed with abdominal CT as clinically indicated.   Electronically Signed   By: Simonne Come M.D.   On: 02/02/2013 17:23   US Renal  01/31/2013   CLINICAL DATA:  Evaluate for hydronephrosis. Recent shock wave lithotripsy on the left. Possible UTI with mental status changes.  EXAM: RENAL/URINARY TRACT ULTRASOUND COMPLETE  COMPARISON:  Renal ultrasound 01/26/2013 and CT abdomen pelvis 01/03/2013  FINDINGS: Right Kidney:  Length: 10.1 cm. There is slight diffuse cortical thinning of the right kidney. Echogenicity of the cortex is slightly increased. Approximately two tiny echogenic areas are seen within the right renal collecting system, likely reflecting nonobstructing stones. No mass or hydronephrosis visualized.  Left Kidney:  Length: Measures 9.7 cm in sagittal length. There is slight thinning of the renal cortex. The cortex appears within normal limits for echogenicity. There is a stable upper pole central sinus cyst. Negative for hydronephrosis. 6 x 1 mm echogenic area within the midpole of left kidney may reflect a small stone fragment.  There is no evidence of ureteral dilatation.  Bladder:  The urinary bladder is moderately distended at the time imaging. There is a moderate amount of echogenic debris layering dependently within the bladder. Some of the echogenic areas are somewhat rounded and could be stone fragments or blood clots.  IMPRESSION: 1. Negative for hydronephrosis. 2. Moderate amount of debris layering within the urinary bladder. Some of the echogenic debris within the bladder somewhat rounded in could reflect stone fragments given history of recent  lithotripsy, or blood clots. 3. Renal sinus cysts left kidney. 4. Suspect 1 or 2 small nonobstructing  stones in both kidneys.   Electronically Signed   By: Britta Mccreedy M.D.   On: 01/31/2013 09:49   Dg Chest Portable 1 View  02/26/2013   CLINICAL DATA:  Altered mental status, cough and fever.  EXAM: PORTABLE CHEST - 1 VIEW  COMPARISON:  Chest radiograph performed 01/03/2013  FINDINGS: The lungs are hypoexpanded. Vascular congestion is seen, with bilateral patchy airspace opacities. This may reflect pneumonia or mild interstitial edema. No pleural effusion or pneumothorax is identified.  The cardiomediastinal silhouette is borderline enlarged. The patient is status post median sternotomy, with evidence of prior CABG. No acute osseous abnormalities are seen.  IMPRESSION: Lungs hypoexpanded. Vascular congestion and borderline cardiomegaly, with patchy bilateral airspace opacities. This may reflect pneumonia or mild interstitial edema.   Electronically Signed   By: Roanna Raider M.D.   On: 02/26/2013 22:42         Subjective: Patient is less alert, but he wakes up to voice. He denies any headache, chest pain, shortness of breath, vomiting, abdominal pain, diarrhea.  Objective: Filed Vitals:   03/01/13 0600 03/01/13 0800 03/01/13 0838 03/01/13 1000  BP: 97/47 106/61  92/46  Pulse: 75 78  72  Temp:  97.7 F (36.5 C)    TempSrc:  Oral    Resp: 20 17  20   Height:      Weight:      SpO2: 96% 94% 93% 95%    Intake/Output Summary (Last 24 hours) at 03/01/13 1154 Last data filed at 03/01/13 1100  Gross per 24 hour  Intake 2021.25 ml  Output   1100 ml  Net 921.25 ml   Weight change: 1.6 kg (3 lb 8.4 oz) Exam:   General:  Pt is alert, follows commands appropriately, not in acute distress  HEENT: No icterus, No thrush,[place]/AT  Cardiovascular: RRR, S1/S2, no rubs, no gallops  Respiratory: Bibasilar crackles. No wheezing. Good air movement.  Abdomen: Soft/+BS, non tender, non distended, no guarding  Extremities: trac LE edema, No lymphangitis, No petechiae, No rashes, no  synovitis  Data Reviewed: Basic Metabolic Panel:  Recent Labs Lab 02/26/13 2302 02/27/13 0526 03/01/13 0333  NA 141 142 140  K 4.2 3.9 3.8  CL 103 108 109  CO2 24 24 22   GLUCOSE 117* 126* 98  BUN 24* 25* 20  CREATININE 1.39* 1.55* 1.15  CALCIUM 8.9 7.9* 7.8*   Liver Function Tests:  Recent Labs Lab 02/26/13 2302 02/27/13 0526  AST 42* 23  ALT 25 19  ALKPHOS 115 93  BILITOT 0.7 0.6  PROT 6.6 4.7*  ALBUMIN 3.1* 2.4*   No results found for this basename: LIPASE, AMYLASE,  in the last 168 hours No results found for this basename: AMMONIA,  in the last 168 hours CBC:  Recent Labs Lab 02/26/13 2302 02/27/13 0526 03/01/13 0333  WBC 15.6* 13.5* 8.2  NEUTROABS 14.7* 12.5*  --   HGB 13.5 11.7* 11.0*  HCT 40.6 36.2* 33.2*  MCV 100.0 100.0 97.6  PLT 219 172 161   Cardiac Enzymes: No results found for this basename: CKTOTAL, CKMB, CKMBINDEX, TROPONINI,  in the last 168 hours BNP: No components found with this basename: POCBNP,  CBG: No results found for this basename: GLUCAP,  in the last 168 hours  Recent Results (from the past 240 hour(s))  URINE CULTURE     Status: None   Collection Time  02/26/13  9:17 PM      Result Value Range Status   Specimen Description URINE, CATHETERIZED   Final   Special Requests NONE   Final   Culture  Setup Time     Final   Value: 02/27/2013 03:52     Performed at [facility] Count     Final   Value: >=100,000 COLONIES/ML     Performed at [facility]   Culture     Final   Value: PSEUDOMONAS AERUGINOSA     Performed at [facility]   Report Status 03/01/2013 FINAL   Final   Organism ID, Bacteria PSEUDOMONAS AERUGINOSA   Final  CULTURE, BLOOD (ROUTINE X 2)     Status: None   Collection Time    02/26/13 11:02 PM      Result Value Range Status   Specimen Description BLOOD LEFT FOREARM   Final   Special Requests BOTTLES DRAWN AEROBIC AND ANAEROBIC 4CC   Final   Culture  Setup Time      Final   Value: 02/27/2013 03:49     Performed at [facility]   Culture     Final   Value:        BLOOD CULTURE RECEIVED NO GROWTH TO DATE CULTURE WILL BE HELD FOR 5 DAYS BEFORE ISSUING A FINAL NEGATIVE REPORT     Performed at [facility]   Report Status PENDING   Incomplete  CULTURE, BLOOD (ROUTINE X 2)     Status: None   Collection Time    02/26/13 11:17 PM      Result Value Range Status   Specimen Description BLOOD LEFT HAND   Final   Special Requests BOTTLES DRAWN AEROBIC ONLY 3CC   Final   Culture  Setup Time     Final   Value: 02/27/2013 03:49     Performed at [facility]   Culture     Final   Value:        BLOOD CULTURE RECEIVED NO GROWTH TO DATE CULTURE WILL BE HELD FOR 5 DAYS BEFORE ISSUING A FINAL NEGATIVE REPORT     Performed at [facility]   Report Status PENDING   Incomplete  MRSA PCR SCREENING     Status: Abnormal   Collection Time    02/27/13  2:50 AM      Result Value Range Status   MRSA by PCR POSITIVE (*) NEGATIVE Final   Comment:            The GeneXpert MRSA Assay (FDA     approved for NASAL specimens     only), is one component of a     comprehensive MRSA colonization     surveillance program. It is not     intended to diagnose MRSA     infection nor to guide or     monitor treatment for     MRSA infections.     RESULT CALLED TO, READ BACK BY AND VERIFIED WITH:     MITCHELL,C RN @0508  ON 12.27.2014 BY MCREYNOLDS,B     Scheduled Meds: . albuterol  2.5 mg Nebulization Q6H  . ceFEPime (MAXIPIME) IV  1 g Intravenous Q24H  . Chlorhexidine Gluconate Cloth  6 each Topical Q0600  . enoxaparin (LOVENOX) injection  40 mg Subcutaneous Q24H  . erythromycin  1 application Both Eyes QID  . ipratropium  0.5 mg Nebulization Q6H  . mupirocin ointment  1 application  Nasal BID  . oseltamivir  75 mg Oral Daily  . sodium chloride  3 mL Intravenous Q12H  . vancomycin  1,000 mg Intravenous Q24H   Continuous Infusions: . sodium  chloride 75 mL/hr at 03/01/13 0110     Chala Gul, DO  [facility] Pager [phone]  If 7PM-7AM, please contact night-coverage [email or web address] Password [email or web address] 03/01/2013, 11:54 AM   LOS: 3 days

## 2013-03-01 NOTE — Progress Notes (Signed)
Spoke with daughter about patient's request for an [place] priest to bring communion. She said that patient is a lifetime Episcopalian but does not have a church here because he came from [place] to be near his daughter. She said he was not very alert today so he probably wouldn't be aware of a communion ritual. Chaplain read the [facility] "Ministration to the Sick" ritual from the Book of Common Prayer. Patient drowsy but responded Amen at end of prayers. The ritual seemed to bring him comfort. He fell asleep at the end. Told daughter that I would bring communion tomorrow if he is more alert.

## 2013-03-02 NOTE — Progress Notes (Signed)
CSW received referral that [facility] does not have bed availability for pt.   CSW spoke to pt daughter, Ruben Morrison via telephone. CSW notified pt daughter Ruben Morrison that there is no bed availability at [facility]. Pt daughter agreeable to referral to [facility] as second choice and [facility] as third choice.  CSW made referrals to [facility] and [facility].  CSW to continue to follow for residential hospice placement.  Jacklynn Lewis, MSW, LCSW Clinical Social Work [phone]

## 2013-03-02 NOTE — Progress Notes (Signed)
[facility] PROGRESS NOTE  Ruben Morrison ZOX:[id number] DOB: 07-15-1925 DOA: 02/26/2013 PCP: Kimber Relic, MD  Assessment/Plan: Severe Sepsis  -No longer active issue as the patient has been transitioned to full comfort care -Present at the time of admission  -Secondary to UTI and HCAP  -Continue IV fluids  -Although blood pressure remained soft, it is stable  HCAP  -No longer an active issue as the patient has been transitioned to full comfort care -Continue vancomycin and cefepime  -WBC is improving  -Blood pressure although marginal is gradually improving  UTI--Pseudomonas  -No longer an active issue as the patient has been transitioned to full comfort care -Continue vancomycin and cefepime pending culture data  Acute encephalopathy  -Less alert today, but still improved from the time of admission  -I expect the patient to have waxing and waning mentation  -Daughter at the bedside states that mentation overall still better than at the time of admission  Influenza  -Continue Tamiflu (02/28/2013) as the patient has multiple risk factors for complications if pt is able to take the pills CKD stage IV  -Baseline creatinine 1.1-1.4  Ureteral stricture  -Continue Foley catheter  -Will not remove at this time  Hx of CLL  -no longer active issue CODE STATUS  -DNR/DNI confirmed with daughter  -Full comfort care -prognosis poor--expect death in days to few weeks  Family Communication: no family today Disposition Plan: residential hospice     Antibiotics:  Vancomycin 02/26/2013>>> 03/01/2013 Cefepime 02/27/2013>>> 03/01/2013 Tamiflu 02/28/2013>>>          Procedures/Studies: Dg Abd 1 View  02/02/2013   CLINICAL DATA:  Post left-sided ureteral ESWL  EXAM: ABDOMEN - 1 VIEW  COMPARISON:  Abdominal radiograph - 01/22/2013; 01/14/2013; CT abdomen pelvis -01/03/2013  FINDINGS: There are 2 discrete punctate opacities which overlie the expected location of the  superior aspect of the left ureter with dominant opacity measuring approximately 0.6 x 0.4 cm an additional opacity measuring approximately 0.4 x 0.4 cm.  No additional opacities overlie the expected location of either renal fossa, the right ureter or the urinary bladder, though note, evaluation somewhat degraded secondary to overlying bowel gas and stool.  Multilevel lumbar spine DDD. Brachytherapy seeds overlie the lower pelvis.  IMPRESSION: Grossly unchanged size and location of suspected adjacent renal stones within the superior aspect of the left ureter. Further evaluation could be performed with abdominal CT as clinically indicated.   Electronically Signed   By: Simonne Come M.D.   On: 02/02/2013 17:23   Dg Chest Portable 1 View  02/26/2013   CLINICAL DATA:  Altered mental status, cough and fever.  EXAM: PORTABLE CHEST - 1 VIEW  COMPARISON:  Chest radiograph performed 01/03/2013  FINDINGS: The lungs are hypoexpanded. Vascular congestion is seen, with bilateral patchy airspace opacities. This may reflect pneumonia or mild interstitial edema. No pleural effusion or pneumothorax is identified.  The cardiomediastinal silhouette is borderline enlarged. The patient is status post median sternotomy, with evidence of prior CABG. No acute osseous abnormalities are seen.  IMPRESSION: Lungs hypoexpanded. Vascular congestion and borderline cardiomegaly, with patchy bilateral airspace opacities. This may reflect pneumonia or mild interstitial edema.   Electronically Signed   By: Roanna Raider M.D.   On: 02/26/2013 22:42         Subjective: Patient did not have any distress. No respiratory distress or pain. No vomiting or diarrhea. He awakens to voice. He is pleasantly confused. He denies any chest pain, shortness of breath.  Objective:  Filed Vitals:   03/01/13 1200 03/01/13 1600 03/01/13 2037 03/02/13 0524  BP:   132/62 120/61  Pulse:   80 75  Temp: 98.6 F (37 C) 98.3 F (36.8 C) 98 F (36.7 C) 97  F (36.1 C)  TempSrc: Oral Oral Oral Oral  Resp:   20 18  Height:      Weight:      SpO2:   99% 94%    Intake/Output Summary (Last 24 hours) at 03/02/13 2028 Last data filed at 03/02/13 1500  Gross per 24 hour  Intake    480 ml  Output    800 ml  Net   -320 ml   Weight change:  Exam:   General:  Pt is resting comfortably. Awakens to voice. No distress  HEENT: No icterus, No thrush,[place]/AT  Cardiovascular: RRR, S1/S2, no rubs,  Abdomen: Soft/+BS, non tender, non distended, no guarding  Extremities: No edema, No lymphangitis, No petechiae, No rashes  Data Reviewed: Basic Metabolic Panel:  Recent Labs Lab 02/26/13 2302 02/27/13 0526 03/01/13 0333  NA 141 142 140  K 4.2 3.9 3.8  CL 103 108 109  CO2 24 24 22   GLUCOSE 117* 126* 98  BUN 24* 25* 20  CREATININE 1.39* 1.55* 1.15  CALCIUM 8.9 7.9* 7.8*   Liver Function Tests:  Recent Labs Lab 02/26/13 2302 02/27/13 0526  AST 42* 23  ALT 25 19  ALKPHOS 115 93  BILITOT 0.7 0.6  PROT 6.6 4.7*  ALBUMIN 3.1* 2.4*   No results found for this basename: LIPASE, AMYLASE,  in the last 168 hours No results found for this basename: AMMONIA,  in the last 168 hours CBC:  Recent Labs Lab 02/26/13 2302 02/27/13 0526 03/01/13 0333  WBC 15.6* 13.5* 8.2  NEUTROABS 14.7* 12.5*  --   HGB 13.5 11.7* 11.0*  HCT 40.6 36.2* 33.2*  MCV 100.0 100.0 97.6  PLT 219 172 161   Cardiac Enzymes: No results found for this basename: CKTOTAL, CKMB, CKMBINDEX, TROPONINI,  in the last 168 hours BNP: No components found with this basename: POCBNP,  CBG: No results found for this basename: GLUCAP,  in the last 168 hours  Recent Results (from the past 240 hour(s))  URINE CULTURE     Status: None   Collection Time    02/26/13  9:17 PM      Result Value Range Status   Specimen Description URINE, CATHETERIZED   Final   Special Requests NONE   Final   Culture  Setup Time     Final   Value: 02/27/2013 03:52     Performed at [facility] Count     Final   Value: >=100,000 COLONIES/ML     Performed at [facility]   Culture     Final   Value: PSEUDOMONAS AERUGINOSA     Performed at [facility]   Report Status 03/01/2013 FINAL   Final   Organism ID, Bacteria PSEUDOMONAS AERUGINOSA   Final  CULTURE, BLOOD (ROUTINE X 2)     Status: None   Collection Time    02/26/13 11:02 PM      Result Value Range Status   Specimen Description BLOOD LEFT FOREARM   Final   Special Requests BOTTLES DRAWN AEROBIC AND ANAEROBIC 4CC   Final   Culture  Setup Time     Final   Value: 02/27/2013 03:49     Performed at [facility]  Final   Value:        BLOOD CULTURE RECEIVED NO GROWTH TO DATE CULTURE WILL BE HELD FOR 5 DAYS BEFORE ISSUING A FINAL NEGATIVE REPORT     Performed at [facility]   Report Status PENDING   Incomplete  CULTURE, BLOOD (ROUTINE X 2)     Status: None   Collection Time    02/26/13 11:17 PM      Result Value Range Status   Specimen Description BLOOD LEFT HAND   Final   Special Requests BOTTLES DRAWN AEROBIC ONLY 3CC   Final   Culture  Setup Time     Final   Value: 02/27/2013 03:49     Performed at [facility]   Culture     Final   Value:        BLOOD CULTURE RECEIVED NO GROWTH TO DATE CULTURE WILL BE HELD FOR 5 DAYS BEFORE ISSUING A FINAL NEGATIVE REPORT     Performed at [facility]   Report Status PENDING   Incomplete  MRSA PCR SCREENING     Status: Abnormal   Collection Time    02/27/13  2:50 AM      Result Value Range Status   MRSA by PCR POSITIVE (*) NEGATIVE Final   Comment:            The GeneXpert MRSA Assay (FDA     approved for NASAL specimens     only), is one component of a     comprehensive MRSA colonization     surveillance program. It is not     intended to diagnose MRSA     infection nor to guide or     monitor treatment for     MRSA infections.     RESULT CALLED TO, READ BACK BY AND VERIFIED WITH:      MITCHELL,C RN @0508  ON 12.27.2014 BY MCREYNOLDS,B     Scheduled Meds: . Chlorhexidine Gluconate Cloth  6 each Topical Q0600  . oseltamivir  75 mg Oral Daily  . sodium chloride  3 mL Intravenous Q12H   Continuous Infusions:    Moussa Wiegand, DO  [facility] Pager [phone]  If 7PM-7AM, please contact night-coverage [email or web address] Password [email or web address] 03/02/2013, 8:28 PM   LOS: 4 days

## 2013-03-03 MED ORDER — ONDANSETRON 8 MG PO TBDP: 8.0000 mg | ORAL_TABLET | Freq: Three times a day (TID) | ORAL | Status: AC | PRN

## 2013-03-03 MED ORDER — MORPHINE SULFATE (CONCENTRATE) 10 MG /0.5 ML PO SOLN: 4.0000 mg | ORAL | Status: DC | PRN

## 2013-03-03 MED ORDER — MORPHINE SULFATE (CONCENTRATE) 10 MG /0.5 ML PO SOLN: 4.0000 mg | ORAL | Status: AC | PRN

## 2013-03-03 NOTE — Discharge Summary (Signed)
Physician Discharge Summary  Ruben Morrison WUJ:[id number] DOB: [birth year] DOA: 02/26/2013  PCP: Kimber Relic, MD  Admit date: 02/26/2013 Discharge date: 03/03/2013    Discharge Diagnoses:  Principal Problem:   Comfort measures only status Active Problems:   Dementia   Severe sepsis(995.92)   UTI (lower urinary tract infection)   Pneumonia   Acute encephalopathy   Septic shock(785.52)   Influenza with other respiratory manifestations Severe Sepsis  -No longer active issue as the patient has been transitioned to full comfort care  -Present at the time of admission  -Secondary to UTI and HCAP  -Continue IV fluids  -Although blood pressure remained soft, it is stable  HCAP  -No longer an active issue as the patient has been transitioned to full comfort care  -Continue vancomycin and cefepime  -WBC is improving  -Blood pressure although marginal is gradually improving  UTI--Pseudomonas  -No longer an active issue as the patient has been transitioned to full comfort care  -Continue vancomycin and cefepime pending culture data  Acute encephalopathy  -Less alert today, but still improved from the time of admission  -I expect the patient to have waxing and waning mentation  -Daughter at the bedside states that mentation overall still better than at the time of admission  Influenza  -Continue Tamiflu (02/28/2013) as the patient has multiple risk factors for complications if pt is able to take the pills  CKD stage IV  -Baseline creatinine 1.1-1.4  Ureteral stricture  -Continue Foley catheter  -Will not remove at this time  Hx of CLL  -no longer active issue  CODE STATUS  -DNR/DNI confirmed with daughter  -Full comfort care  -prognosis poor--expect death in days to few weeks  Family Communication: no family today  Disposition Plan: residential hospice  Antibiotics:  Vancomycin 02/26/2013>>> 03/01/2013  Cefepime 02/27/2013>>> 03/01/2013  Tamiflu  02/28/2013>>>   Discharge Condition: Stable  Disposition:  Residential hospice Diet: Comfort feeds Wt Readings from Last 3 Encounters:  03/01/13 74.9 kg (165 lb 2 oz)  01/26/13 77.111 kg (170 lb)  01/14/13 77.111 kg (170 lb)    History of present illness:  79 y.o. male with history of dementia, CAD, urethral strictures and recurrent UTI on chronic suppressive therapy, history of renal stones was brought to the ER because of fever and confusion. As per patient's daughter who provided most of the history patient had some difficulty swallowing night before this current episode. Since morning prior to admission patient has become more confused and has been having productive cough. Patient was found to have fever of 103F. Patient was brought to the ER. In the ER patient x-ray shows possible pneumonia and UA shows features of UTI. Patient has been started vancomycin and cefepime and admitted for further management. As per patient's daughter speech therapist has stated that patient's swallowing difficulties due to patient's fever. Patient to 3 days ago had gone to her urologist and at that time patient was felt to be dehydrated. Patient otherwise did not have any nausea vomiting or complain of any chest pain or shortness of breath abdominal pain diarrhea. The time of admission, the patient was noted to be hypotensive with fever. He was started on vancomycin and cefepime. Influenza PCR was positive. The patient was started on Tamiflu. The patient's blood pressure gradually improved and his mentation improved although he continued to have waxing and waning episodes. Discussions were held with the family on a daily basis. This included the patient's daughters who are his medical powers of  attorney. After extensive discussions, the patient's family felt that it was in the patient's best interest to convert him to a focus of comfort care. On non-essential labs, blood draws, and medications were discontinued. The  patient was transferred to the medical floor. His care was focused on comfort measures. Social work assisted and transferred to residential hospice. The patient denies any other signs of distress or pain throughout the rest of the hospitalization. The patient was transitioned to Roxanol when necessary pain and respiratory distress.       Discharge Exam: Filed Vitals:   03/03/13 0448  BP: 111/64  Pulse: 74  Temp: 96.6 F (35.9 C)  Resp: 16   Filed Vitals:   03/01/13 2037 03/02/13 0524 03/02/13 2030 03/03/13 0448  BP: 132/62 120/61  111/64  Pulse: 80 75 60 74  Temp: 98 F (36.7 C) 97 F (36.1 C)  96.6 F (35.9 C)  TempSrc: Oral Oral  Axillary  Resp: 20 18  16   Height:      Weight:      SpO2: 99% 94%  96%   General: Awake and alert, NAD, pleasant, cooperative Cardiovascular: RRR, no rub, no gallop, no S3 Respiratory: Scattered rales bilaterally. Good air movement. Abdomen:soft, nontender, nondistended, positive bowel sounds Extremities: trace LE edema, No lymphangitis, no petechiae  Discharge Instructions      Discharge Orders   Future Orders Complete By Expires   Diet - low sodium heart healthy  As directed    Increase activity slowly  As directed        Medication List    STOP taking these medications       aspirin 81 MG chewable tablet     atorvastatin 20 MG tablet  Commonly known as:  LIPITOR     calcium carbonate 500 MG chewable tablet  Commonly known as:  TUMS - dosed in mg elemental calcium     CEROVITE SENIOR PO     citalopram 10 MG tablet  Commonly known as:  CELEXA     erythromycin ophthalmic ointment     mirtazapine 15 MG tablet  Commonly known as:  REMERON     oxyCODONE 5 MG immediate release tablet  Commonly known as:  Oxy IR/ROXICODONE     polyethylene glycol packet  Commonly known as:  MIRALAX / GLYCOLAX     tamsulosin 0.4 MG Caps capsule  Commonly known as:  FLOMAX     tiotropium 18 MCG inhalation capsule  Commonly known as:   SPIRIVA     trimethoprim 100 MG tablet  Commonly known as:  TRIMPEX      TAKE these medications       acetaminophen 650 MG suppository  Commonly known as:  TYLENOL  Place 650 mg rectally every 4 (four) hours as needed for moderate pain or fever.     albuterol (2.5 MG/3ML) 0.083% nebulizer solution  Commonly known as:  PROVENTIL  Take 2.5 mg by nebulization every 4 (four) hours as needed for wheezing or shortness of breath.     morphine CONCENTRATE 10 mg / 0.5 ml concentrated solution  Take 0.2 mLs (4 mg total) by mouth every 2 (two) hours as needed for severe pain.     ondansetron 8 MG disintegrating tablet  Commonly known as:  ZOFRAN ODT  Take 1 tablet (8 mg total) by mouth every 8 (eight) hours as needed for nausea or vomiting.         The results of significant diagnostics from this hospitalization (including  imaging, microbiology, ancillary and laboratory) are listed below for reference.    Significant Diagnostic Studies: Dg Abd 1 View  02/02/2013   CLINICAL DATA:  Post left-sided ureteral ESWL  EXAM: ABDOMEN - 1 VIEW  COMPARISON:  Abdominal radiograph - 01/22/2013; 01/14/2013; CT abdomen pelvis -01/03/2013  FINDINGS: There are 2 discrete punctate opacities which overlie the expected location of the superior aspect of the left ureter with dominant opacity measuring approximately 0.6 x 0.4 cm an additional opacity measuring approximately 0.4 x 0.4 cm.  No additional opacities overlie the expected location of either renal fossa, the right ureter or the urinary bladder, though note, evaluation somewhat degraded secondary to overlying bowel gas and stool.  Multilevel lumbar spine DDD. Brachytherapy seeds overlie the lower pelvis.  IMPRESSION: Grossly unchanged size and location of suspected adjacent renal stones within the superior aspect of the left ureter. Further evaluation could be performed with abdominal CT as clinically indicated.   Electronically Signed   By: Simonne Come M.D.    On: 02/02/2013 17:23   Dg Chest Portable 1 View  02/26/2013   CLINICAL DATA:  Altered mental status, cough and fever.  EXAM: PORTABLE CHEST - 1 VIEW  COMPARISON:  Chest radiograph performed 01/03/2013  FINDINGS: The lungs are hypoexpanded. Vascular congestion is seen, with bilateral patchy airspace opacities. This may reflect pneumonia or mild interstitial edema. No pleural effusion or pneumothorax is identified.  The cardiomediastinal silhouette is borderline enlarged. The patient is status post median sternotomy, with evidence of prior CABG. No acute osseous abnormalities are seen.  IMPRESSION: Lungs hypoexpanded. Vascular congestion and borderline cardiomegaly, with patchy bilateral airspace opacities. This may reflect pneumonia or mild interstitial edema.   Electronically Signed   By: Roanna Raider M.D.   On: 02/26/2013 22:42     Microbiology: Recent Results (from the past 240 hour(s))  URINE CULTURE     Status: None   Collection Time    02/26/13  9:17 PM      Result Value Range Status   Specimen Description URINE, CATHETERIZED   Final   Special Requests NONE   Final   Culture  Setup Time     Final   Value: 02/27/2013 03:52     Performed at [facility] Count     Final   Value: >=100,000 COLONIES/ML     Performed at [facility]   Culture     Final   Value: PSEUDOMONAS AERUGINOSA     Performed at [facility]   Report Status 03/01/2013 FINAL   Final   Organism ID, Bacteria PSEUDOMONAS AERUGINOSA   Final  CULTURE, BLOOD (ROUTINE X 2)     Status: None   Collection Time    02/26/13 11:02 PM      Result Value Range Status   Specimen Description BLOOD LEFT FOREARM   Final   Special Requests BOTTLES DRAWN AEROBIC AND ANAEROBIC 4CC   Final   Culture  Setup Time     Final   Value: 02/27/2013 03:49     Performed at [facility]   Culture     Final   Value:        BLOOD CULTURE RECEIVED NO GROWTH TO DATE CULTURE WILL BE HELD FOR 5 DAYS BEFORE  ISSUING A FINAL NEGATIVE REPORT     Performed at [facility]   Report Status PENDING   Incomplete  CULTURE, BLOOD (ROUTINE X 2)     Status: None  Collection Time    02/26/13 11:17 PM      Result Value Range Status   Specimen Description BLOOD LEFT HAND   Final   Special Requests BOTTLES DRAWN AEROBIC ONLY 3CC   Final   Culture  Setup Time     Final   Value: 02/27/2013 03:49     Performed at [facility]   Culture     Final   Value:        BLOOD CULTURE RECEIVED NO GROWTH TO DATE CULTURE WILL BE HELD FOR 5 DAYS BEFORE ISSUING A FINAL NEGATIVE REPORT     Performed at [facility]   Report Status PENDING   Incomplete  MRSA PCR SCREENING     Status: Abnormal   Collection Time    02/27/13  2:50 AM      Result Value Range Status   MRSA by PCR POSITIVE (*) NEGATIVE Final   Comment:            The GeneXpert MRSA Assay (FDA     approved for NASAL specimens     only), is one component of a     comprehensive MRSA colonization     surveillance program. It is not     intended to diagnose MRSA     infection nor to guide or     monitor treatment for     MRSA infections.     RESULT CALLED TO, READ BACK BY AND VERIFIED WITH:     MITCHELL,C RN @0508  ON 12.27.2014 BY MCREYNOLDS,B     Labs: Basic Metabolic Panel:  Recent Labs Lab 02/26/13 2302 02/27/13 0526 03/01/13 0333  NA 141 142 140  K 4.2 3.9 3.8  CL 103 108 109  CO2 24 24 22   GLUCOSE 117* 126* 98  BUN 24* 25* 20  CREATININE 1.39* 1.55* 1.15  CALCIUM 8.9 7.9* 7.8*   Liver Function Tests:  Recent Labs Lab 02/26/13 2302 02/27/13 0526  AST 42* 23  ALT 25 19  ALKPHOS 115 93  BILITOT 0.7 0.6  PROT 6.6 4.7*  ALBUMIN 3.1* 2.4*   No results found for this basename: LIPASE, AMYLASE,  in the last 168 hours No results found for this basename: AMMONIA,  in the last 168 hours CBC:  Recent Labs Lab 02/26/13 2302 02/27/13 0526 03/01/13 0333  WBC 15.6* 13.5* 8.2  NEUTROABS 14.7* 12.5*  --   HGB  13.5 11.7* 11.0*  HCT 40.6 36.2* 33.2*  MCV 100.0 100.0 97.6  PLT 219 172 161   Cardiac Enzymes: No results found for this basename: CKTOTAL, CKMB, CKMBINDEX, TROPONINI,  in the last 168 hours BNP: No components found with this basename: POCBNP,  CBG: No results found for this basename: GLUCAP,  in the last 168 hours  Time coordinating discharge:  Greater than 30 minutes  Signed:  Dell Briner, DO [facility] Pager: [phone] 03/03/2013, 7:45 AM

## 2013-03-03 NOTE — Progress Notes (Signed)
Pt for discharge to [facility] [place].  CSW facilitated pt discharge needs including contacting facility, faxing pt discharge paperwork to [facility], providing RN phone number to call report, discussing with pt daughter via telephone, and scheduling ambulance transport for pt at 2 pm for transport to [facility] [place].  No further social work needs identified at this time.  CSW signing off.   Jacklynn Lewis, MSW, LCSW Clinical Social Work [phone]

## 2013-03-03 NOTE — Progress Notes (Addendum)
CSW continuing to follow for residential hospice.   CSW spoke with [facility] liaison, Forrestine Him and [facility] continues to not have bed availability.  CSW contacted [facility] and [facility] and left messages in order to follow up re: residential hospice bed availability.  CSW to facilitate pt discharge needs when residential hospice bed available.   Addendum 9:30 am:  CSW received notification from [facility] and [facility] that both facilities had bed available today for pt.   CSW contacted pt daughter and offered pt daughter choice as both facilities have beds available. Pt daughter chooses [facility] [address]  CSW to facilitate pt discharge needs this afternoon as [facility] would like pt to arrive to facility at 3 pm.   CSW updated pt daughter about what time pt will be transitioning to [facility] [place].   Jacklynn Lewis, MSW, LCSW Clinical Social Work [phone]

## 2013-03-03 NOTE — Plan of Care (Signed)
Problem: Phase III Progression Outcomes Goal: Tolerating diet Outcome: Completed/Met Date Met:  03/03/13 Comfort feeds

## 2013-03-04 NOTE — Progress Notes (Signed)
Patient ID: Ruben Morrison, male   DOB: 10/07/1925, 80 y.o.   MRN: [id number]        PROGRESS NOTE  DATE: 01/21/2013    FACILITY:  [facility]  LEVEL OF CARE: SNF (31)  Acute Visit  CHIEF COMPLAINT:  Manage acute renal failure and macrocytosis.    HISTORY OF PRESENT ILLNESS: I was requested by the staff to assess the patient regarding above problem(s):  ACUTE RENAL FAILURE:  On 01/15/2013:  Patient's BUN was 17, creatinine 1.36.   On 01/06/2013:  BUN 17, creatinine 1.32.  The patient is currently not on any renal toxic medications.   He denies confusion or increasing lower extremity swelling.     MACROCYTOSIS:  New problem.  On 01/15/2013:  MCV 100, hemoglobin 13.7.   Patient denies numbness or tingling.    PAST MEDICAL HISTORY : Reviewed.  No changes.  CURRENT MEDICATIONS: Reviewed per [facility]  REVIEW OF SYSTEMS:  GENERAL: no change in appetite, no fatigue, no weight changes, no fever, chills or weakness RESPIRATORY: no cough, SOB, DOE,, wheezing, hemoptysis CARDIAC: no chest pain, edema or palpitations GI: no abdominal pain, diarrhea, constipation, heart burn, nausea or vomiting  PHYSICAL EXAMINATION  GENERAL: no acute distress, moderately obese body habitus NECK: supple, trachea midline, no neck masses, no thyroid tenderness, no thyromegaly RESPIRATORY: breathing is even & unlabored, BS CTAB CARDIAC: RRR, no murmur,no extra heart sounds, no edema GI: abdomen soft, normal BS, no masses, no tenderness, no hepatomegaly, no splenomegaly PSYCHIATRIC: the patient is alert & oriented to person, affect & behavior appropriate  ASSESSMENT/PLAN:  Acute renal failure.  Push fluids and reassess renal functions.     Macrocytosis.  New problem.  Check RBC folate and vitamin B12 level.    THN Metrics:   BP:  107/52.  Aspirin 81 mg q.d.  Never-smoker.    CPT CODE: 42706

## 2013-03-05 ENCOUNTER — Encounter: Payer: Self-pay | Admitting: Internal Medicine

## 2013-03-05 DIAGNOSIS — D7589 Other specified diseases of blood and blood-forming organs: Secondary | ICD-10-CM | POA: Insufficient documentation

## 2013-03-05 LAB — CULTURE, BLOOD (ROUTINE X 2)
Culture: NO GROWTH
Culture: NO GROWTH

## 2013-03-05 NOTE — Progress Notes (Signed)
Discharge summary sent to payer through [facility]  

## 2013-04-04 DEATH — deceased

## 2013-04-15 NOTE — Progress Notes (Signed)
Patient ID: Ruben Morrison, male   DOB: [birth year], 80 y.o.   MRN: [id number]               HISTORY & PHYSICAL  DATE: 02/10/2013     FACILITY: [facility]  LEVEL OF CARE: SNF (31)  ALLERGIES:  Allergies  Allergen Reactions  . Avelox [Moxifloxacin]     Reaction:Unknown Per medication MAR  . Ciprofloxacin     Unknown reaction- per MAR  . Floxin [Ofloxacin]     Unknown reaction- per MAR    CHIEF COMPLAINT:  Manage dementia, hypertension, and ureteral stricture.    HISTORY OF PRESENT ILLNESS:  The patient is an 80 year-old, Caucasian male who was hospitalized secondary to urinary retention.  After hospitalization, he is admitted to this facility for short-term rehabilitation.     URETERAL STRICTURE:  Renal ultrasound did not show hydronephrosis.  Patient is status post cystoscopy with ureteral dilatation.  He denies any difficulties currently.  He currently has a Foley and a follow-up with Urology pending.    HTN: Pt 's HTN remains stable.  Denies CP, sob, DOE, pedal edema, headaches, dizziness or visual disturbances.  No complications from the medications currently being used.  Last BP :   102/62.    DEMENTIA: The dementia remains stable and continues to function adequately in the current living environment with supervision.  The patient has had little changes in behavior. No complications noted from the medications presently being used.    PAST MEDICAL HISTORY :  Past Medical History  Diagnosis Date  . Dementia   . Renal disorder   . Pneumococcal pneumonia   . Hypertension   . Encephalopathy acute   . Prostate cancer   . Coronary artery disease     PAST SURGICAL HISTORY: Past Surgical History  Procedure Laterality Date  . Cardiac surgery    . Insertion prostate radiation seed    . Inguinal hernia repair    . Eye surgery      cataract  . Coronary artery bypass graft  1996    [place], [place]    SOCIAL HISTORY:  reports that he has never smoked. He  has never used smokeless tobacco. He reports that he does not drink alcohol or use illicit drugs.  FAMILY HISTORY: None  CURRENT MEDICATIONS: Reviewed per MAR  REVIEW OF SYSTEMS:  See HPI otherwise 14 point ROS is negative.  PHYSICAL EXAMINATION  VS:  T 98.7       P 74      RR 18      BP 102/62      POX 96%        GENERAL: no acute distress, normal body habitus EYES: conjunctivae normal, sclerae normal, normal eye lids MOUTH/THROAT: lips without lesions,no lesions in the mouth,tongue is without lesions,uvula elevates in midline NECK: supple, trachea midline, no neck masses, no thyroid tenderness, no thyromegaly LYMPHATICS: no LAN in the neck, no supraclavicular LAN RESPIRATORY: breathing is even & unlabored, BS CTAB CARDIAC: RRR, no murmur,no extra heart sounds, no edema GI:  ABDOMEN: abdomen soft, normal BS, no masses, no tenderness  LIVER/SPLEEN: no hepatomegaly, no splenomegaly MUSCULOSKELETAL: HEAD: normal to inspection & palpation BACK: no kyphosis, scoliosis or spinal processes tenderness EXTREMITIES: LEFT UPPER EXTREMITY: full range of motion, normal strength & tone RIGHT UPPER EXTREMITY:  full range of motion, normal strength & tone LEFT LOWER EXTREMITY: strength intact, range of motion minimal   RIGHT LOWER EXTREMITY: strength intact, range of motion  minimal   PSYCHIATRIC: the patient is alert & oriented to person, affect & behavior appropriate  LABS/RADIOLOGY: Abdominal x-ray:  Showed suspected renal stones within the superior aspect of the left ureter.    Urine culture showed no growth.    Blood culture x2 showed no growth.    WBC 11.5, otherwise CBC normal.    Labs reviewed: Basic Metabolic Panel:  Recent Labs  02/01/13 0546 02/02/13 0420 02/03/13 0653 02/26/13 2302 02/27/13 0526 03/01/13 0333  NA 139 138 136 141 142 140  K 3.8 3.5 3.7 4.2 3.9 3.8  CL 108 106 102 103 108 109  CO2 20 25 28 24 24 22   GLUCOSE 117* 118* 103* 117* 126* 98  BUN 9 13 15   24* 25* 20  CREATININE 1.13 1.39* 1.30 1.39* 1.55* 1.15  CALCIUM 8.4 8.3* 8.9 8.9 7.9* 7.8*  MG 1.8 1.9 1.9  --   --   --    Liver Function Tests:  Recent Labs  02/03/13 0653 02/26/13 2302 02/27/13 0526  AST 29 42* 23  ALT 27 25 19   ALKPHOS 109 115 93  BILITOT 0.2* 0.7 0.6  PROT 5.7* 6.6 4.7*  ALBUMIN 2.8* 3.1* 2.4*    Recent Labs  01/03/13 0929  LIPASE 52   CBC:  Recent Labs  01/29/13 0340  02/26/13 2302 02/27/13 0526 03/01/13 0333  WBC 13.2*  < > 15.6* 13.5* 8.2  NEUTROABS 8.3*  --  14.7* 12.5*  --   HGB 15.7  < > 13.5 11.7* 11.0*  HCT 45.1  < > 40.6 36.2* 33.2*  MCV 97.6  < > 100.0 100.0 97.6  PLT 185  < > 219 172 161  < > = values in this interval not displayed.  ASSESSMENT/PLAN:  Ureteral stricture.   Status post Foley catheter.  Follow up with urologist as scheduled.    Hypertension.  Well controlled.    Dementia.  Stable.    UTI.  Patient was treated.  Trimethoprim for long-term suppression.    BPH.  Continue Flomax.    Depression.  Stable.    Check CBC and BMP.    THN Metrics:   Aspirin 81 mg q.d.  Nonsmoker.    I have reviewed patient's medical records received at admission/from hospitalization.  CPT CODE: 15176

## 2014-08-26 IMAGING — CT CT HEAD W/O CM
2 series · 17 of 30 positions shown, 20 images · non-contrast
Comparison: Head CT 03/29/2012.

CLINICAL DATA: Fall with head injury.

EXAM:
CT HEAD WITHOUT CONTRAST
TECHNIQUE: Contiguous axial images were obtained from the base of the skull
through the vertex without intravenous contrast.

[Series 2: head w/o · axial · non-contrast · 0.48mm/px · z∈[+1125,+1250]mm · 9 of 33 slices shown, 12 images]
[im 4/33  brain]
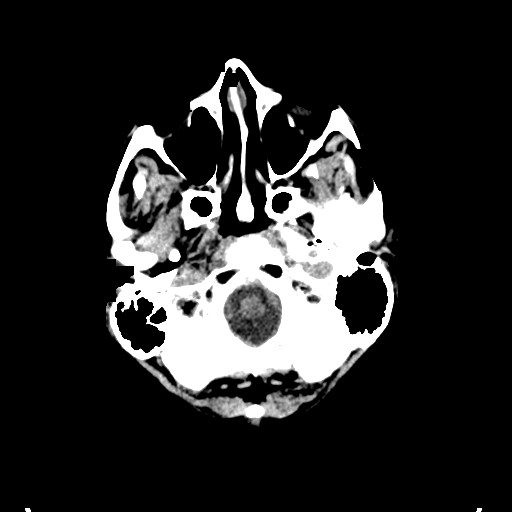
[im 4/33  bone]
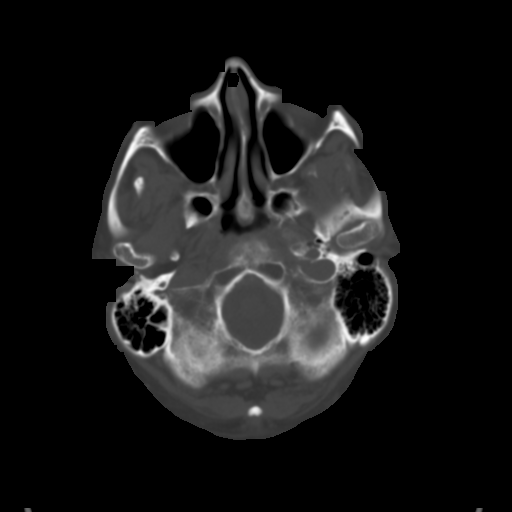
[im 7/33  brain]
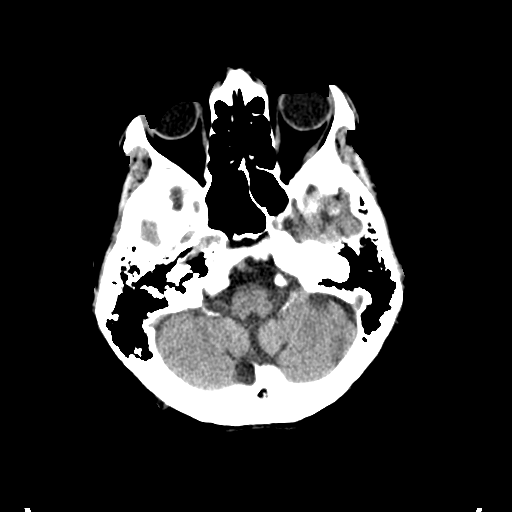
[im 10/33  brain]
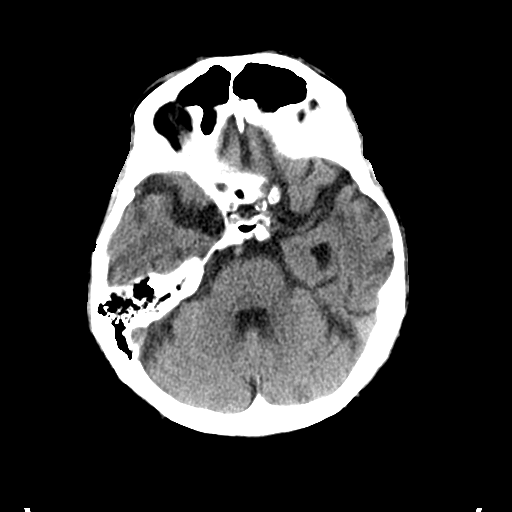
[im 13/33  brain]
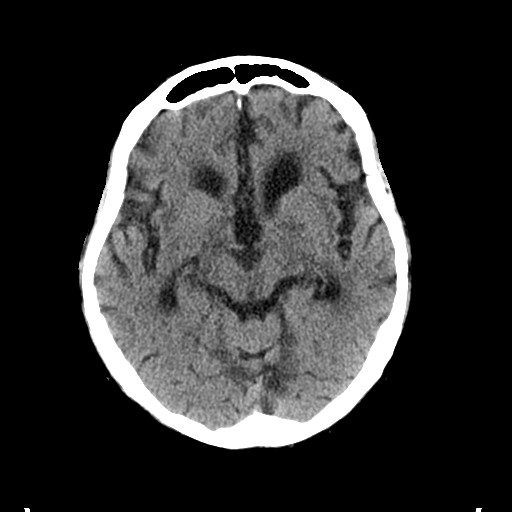
[im 17/33  brain]
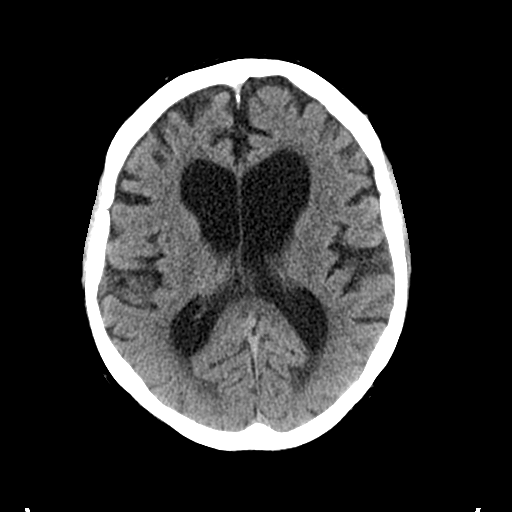
[im 17/33  bone]
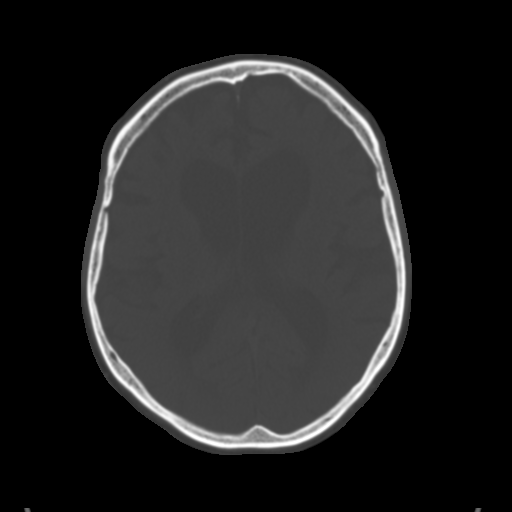
[im 20/33  brain]
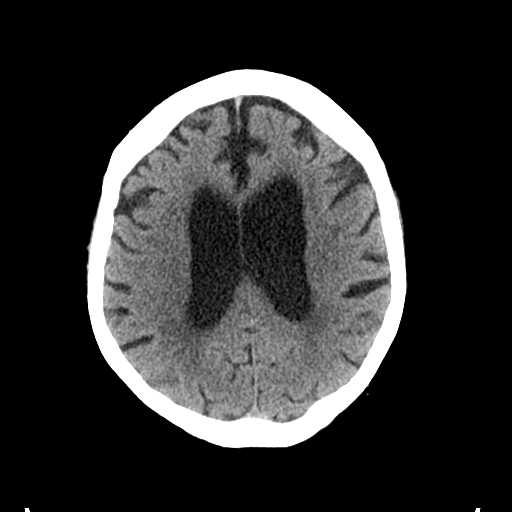
[im 23/33  brain]
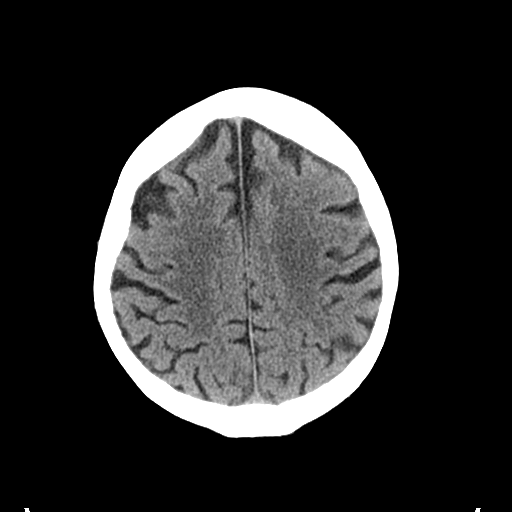
[im 26/33  brain]
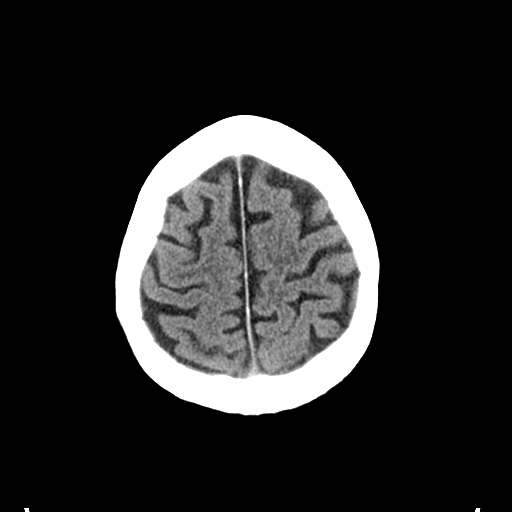
[im 29/33  brain]
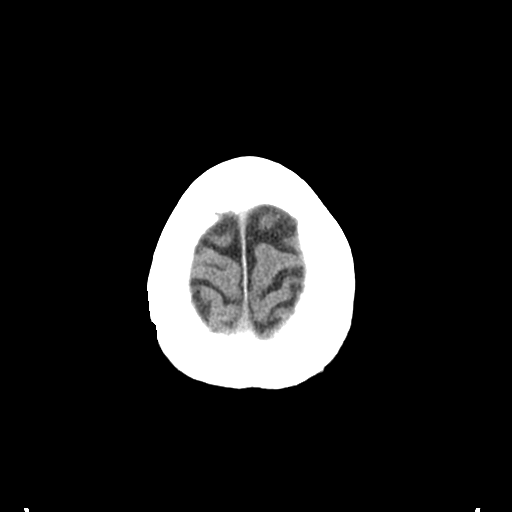
[im 29/33  bone]
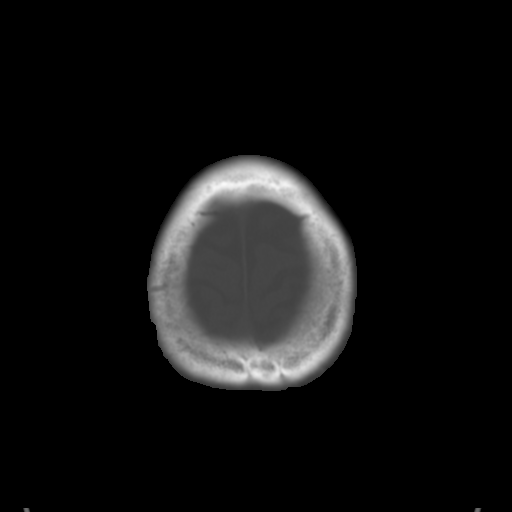

[Series 3: bone windows · axial · 0.48mm/px · z∈[+1125,+1251]mm · 8 of 54 slices shown]
[im 6/54  bone]
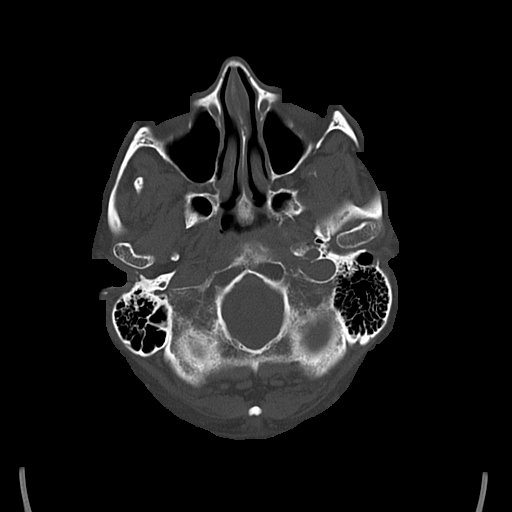
[im 12/54  bone]
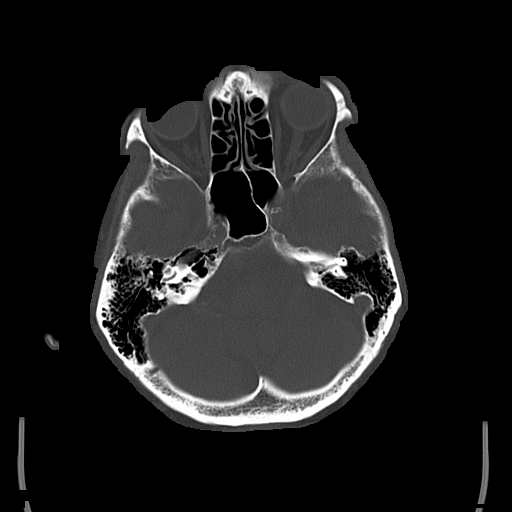
[im 18/54  bone]
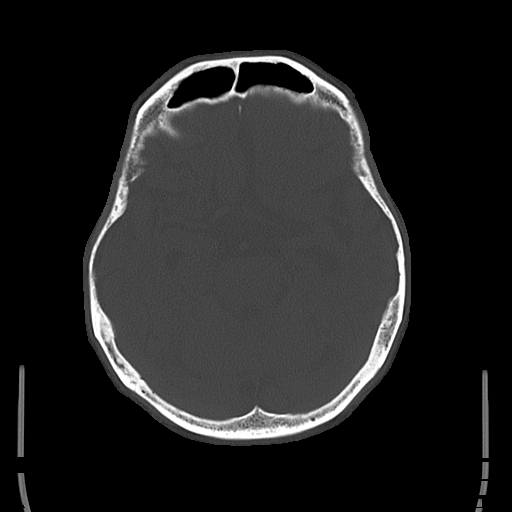
[im 24/54  bone]
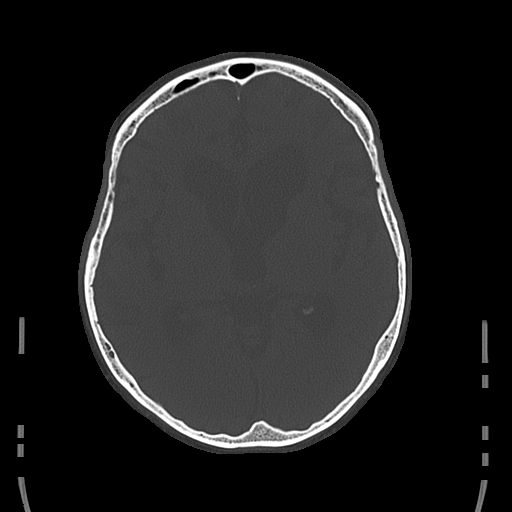
[im 30/54  bone]
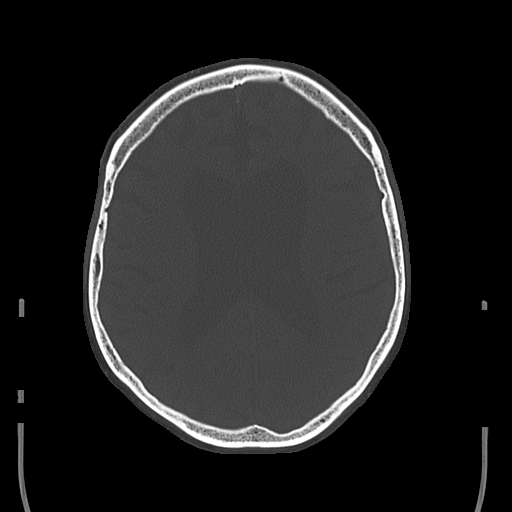
[im 36/54  bone]
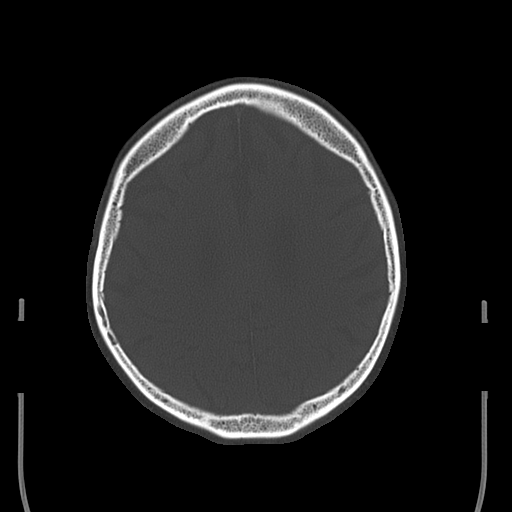
[im 42/54  bone]
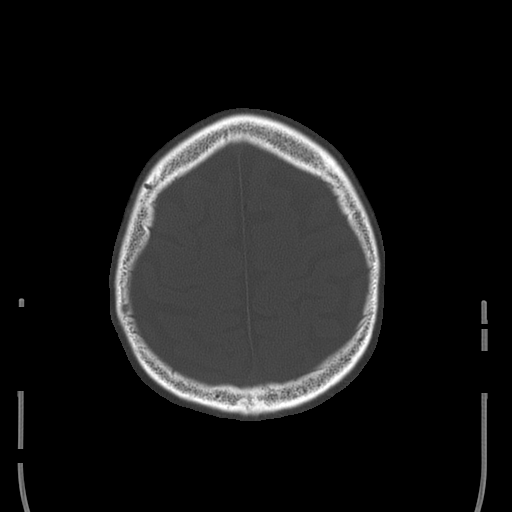
[im 48/54  bone]
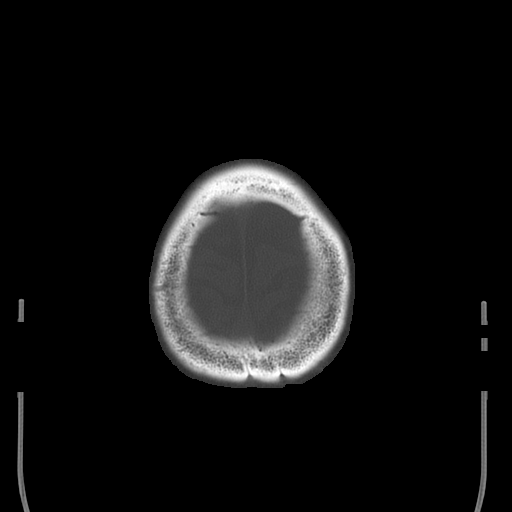

[17 of 30 positions shown; findings below may reference images not displayed]

FINDINGS: There is no evidence of acute intracranial hemorrhage, mass lesion,
brain edema or extra-axial fluid collection. The ventricles and
subarachnoid spaces remain diffusely enlarged consistent with
atrophy. There is no CT evidence of acute cortical infarction.

The visualized paranasal sinuses, mastoid air cells and middle ears
are clear. The calvarium is intact.
IMPRESSION: Stable atrophy.  No acute intracranial or calvarial findings.

## 2014-09-24 IMAGING — CR DG ABDOMEN 1V
1 series · 1 of 1 positions shown · non-contrast
Comparison: CT 01/03/2013.

CLINICAL DATA: Preop lithotripsy.

EXAM:
ABDOMEN - 1 VIEW

[t abdomen supine]
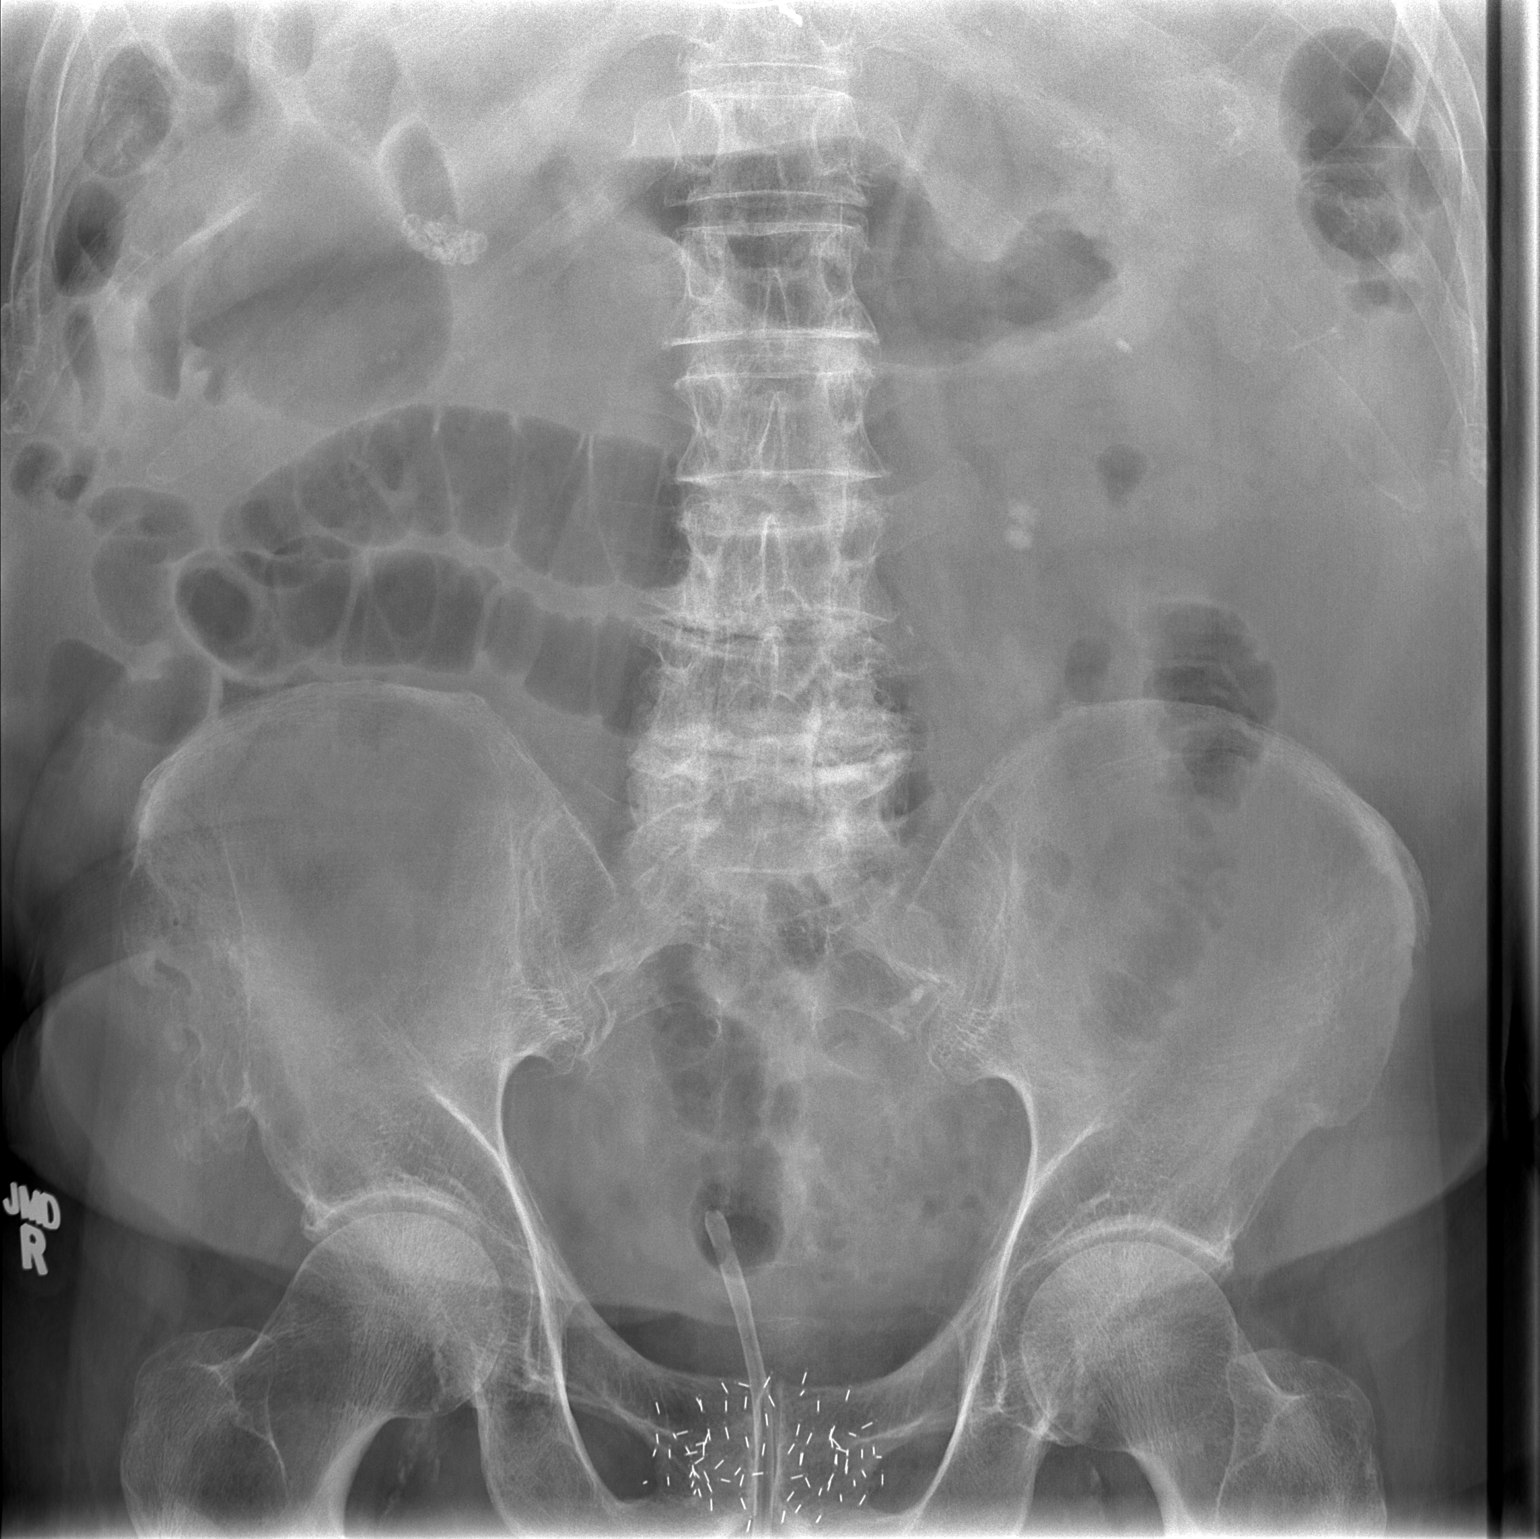

[1 of 1 positions shown; findings below may reference images not displayed]

FINDINGS: 2 approximately 7-8 mm stones are noted projected over the left
upper abdomen, these are most likely in the left upper ureter as
evidenced by prior CT. Bilateral nephrolithiasis present.
Cholelithiasis present. Air-filled loops of small and large bowel
are noted. A mild ileus may be present. Catheter projected over the
bladder. Prostate implant seeds are noted. Degenerative changes and
osteopenia noted the lumbar spine and about the pelvis
IMPRESSION: 1. Proximal left urolithiasis.  Similar finding noted on prior CT.
2. Bilateral nephrolithiasis.
3. Cholelithiasis.
4. Catheter noted over the bladder.  Prostate implant seeds.

## 2014-10-11 IMAGING — US RENAL/URINARY TRACT ULTRASOUND
1 series · 13 of 25 positions shown · non-contrast
Comparison: Renal ultrasound 01/26/[address] abdomen pelvis
01/03/2013

CLINICAL DATA: Evaluate for hydronephrosis. Recent shock wave
lithotripsy on the left. Possible UTI with mental status changes.

EXAM:
RENAL/URINARY TRACT ULTRASOUND COMPLETE

[Series 1: renal/urinary tract ultrasound · 0.25mm/px · 13 of 56 slices shown]
[im 1/56]
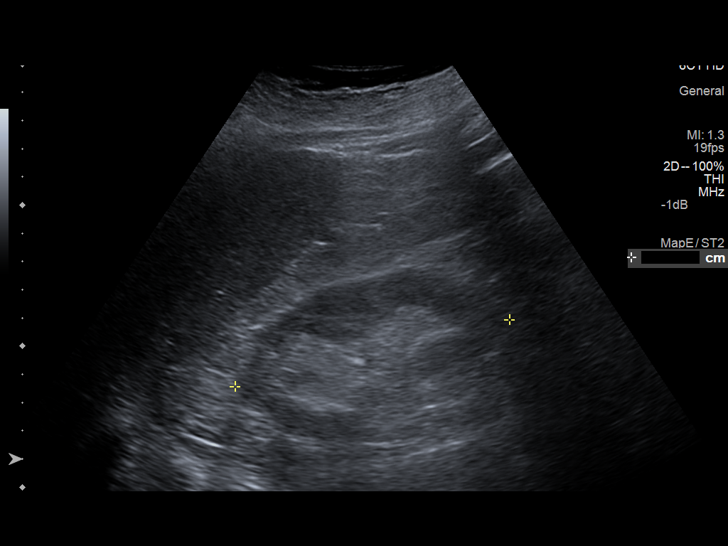
[im 5/56]
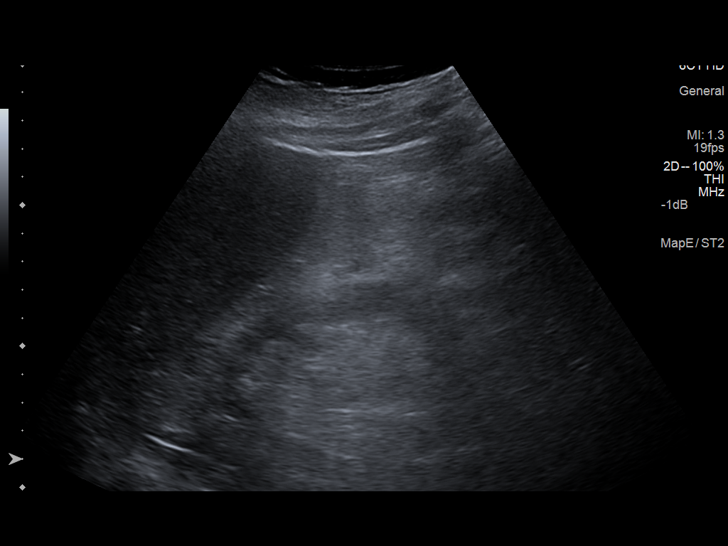
[im 10/56]
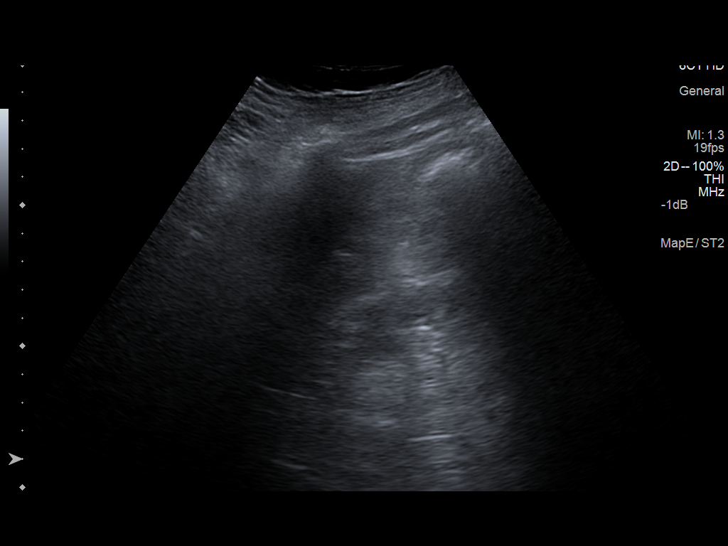
[im 14/56]
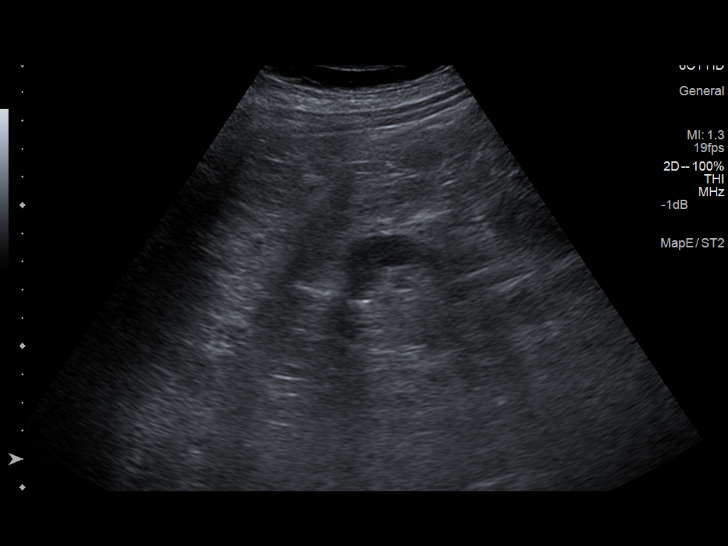
[im 19/56]
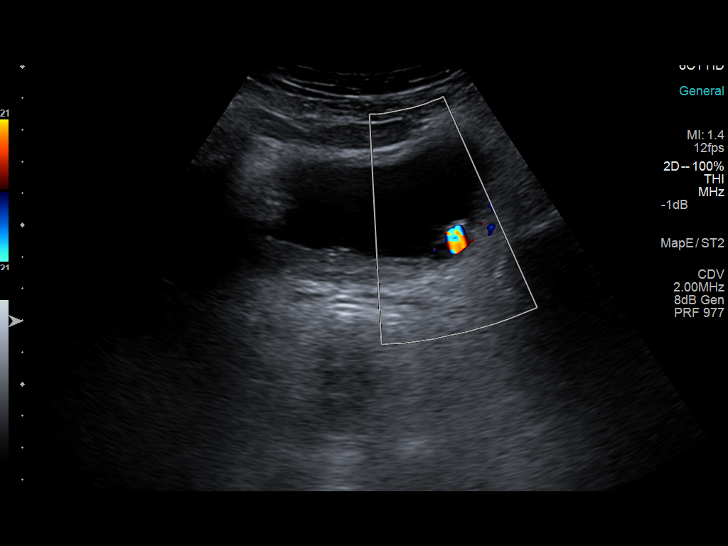
[im 23/56]
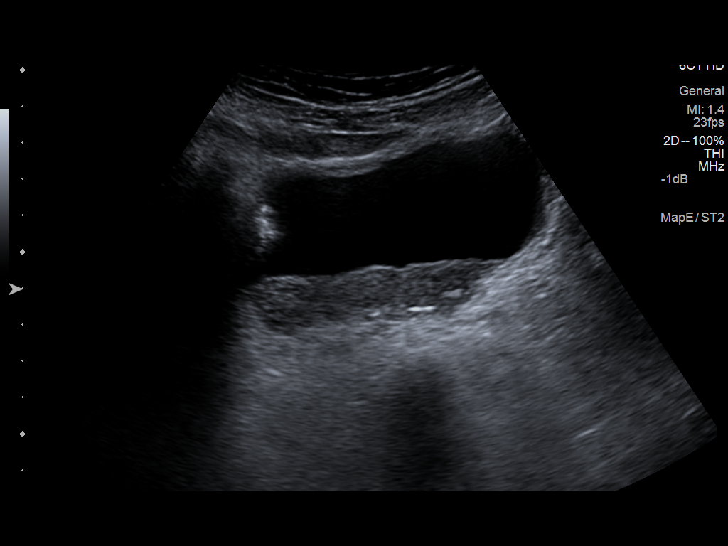
[im 28/56]
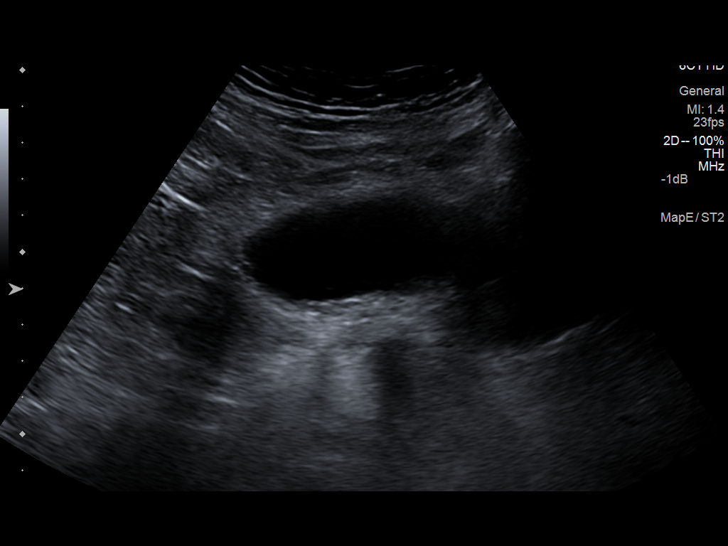
[im 33/56]
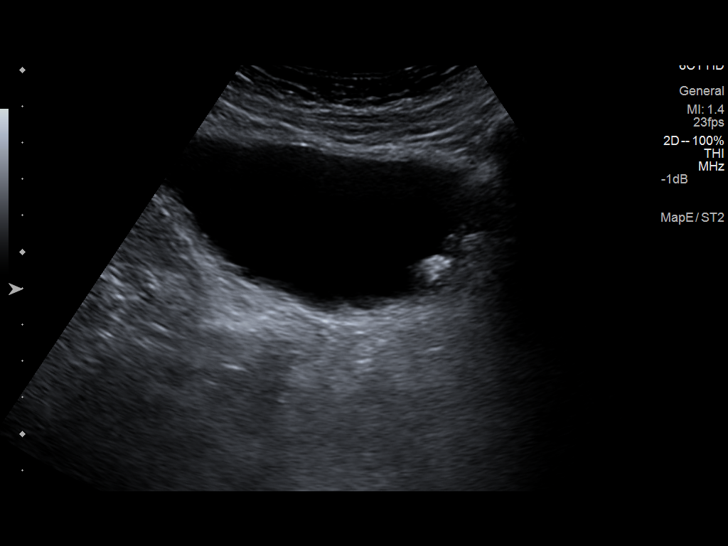
[im 37/56]
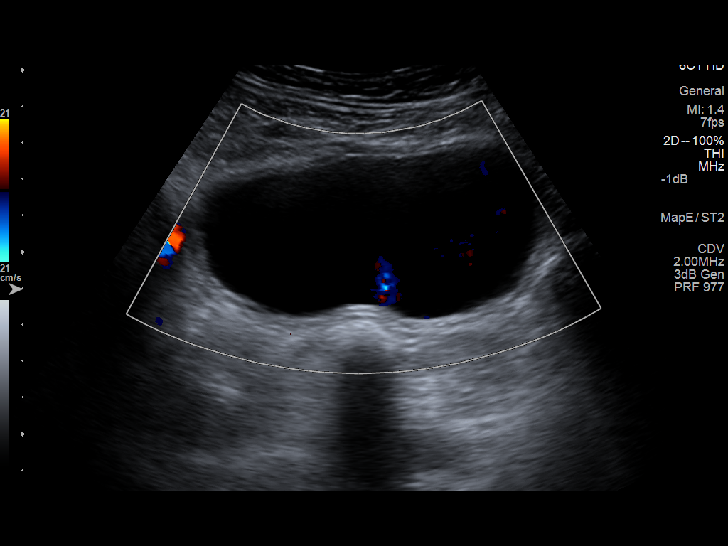
[im 42/56]
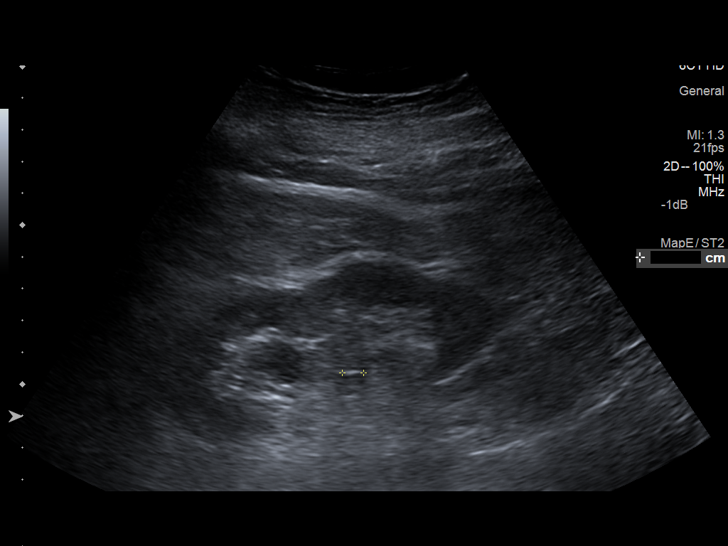
[im 46/56]
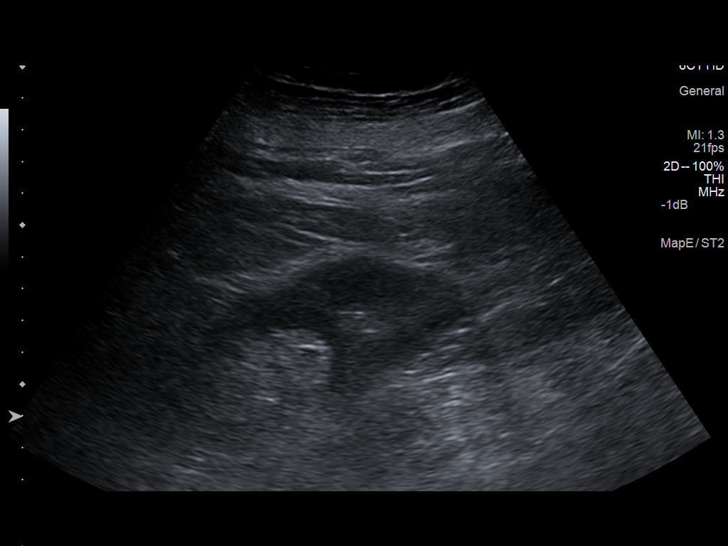
[im 51/56]
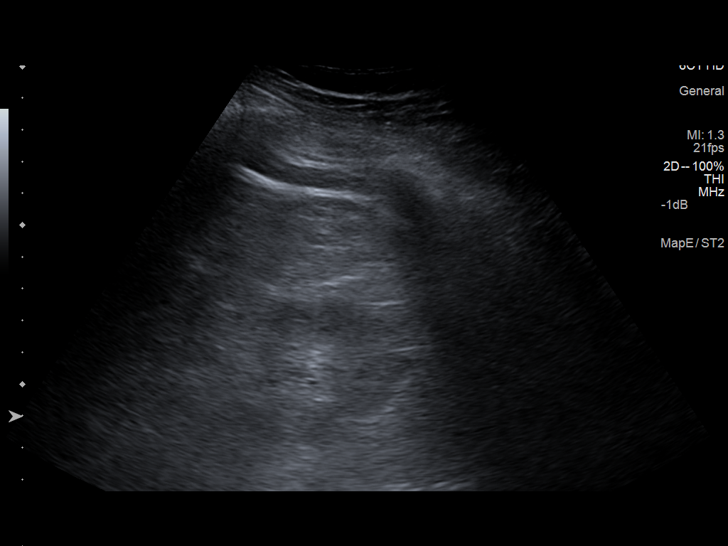
[im 56/56]
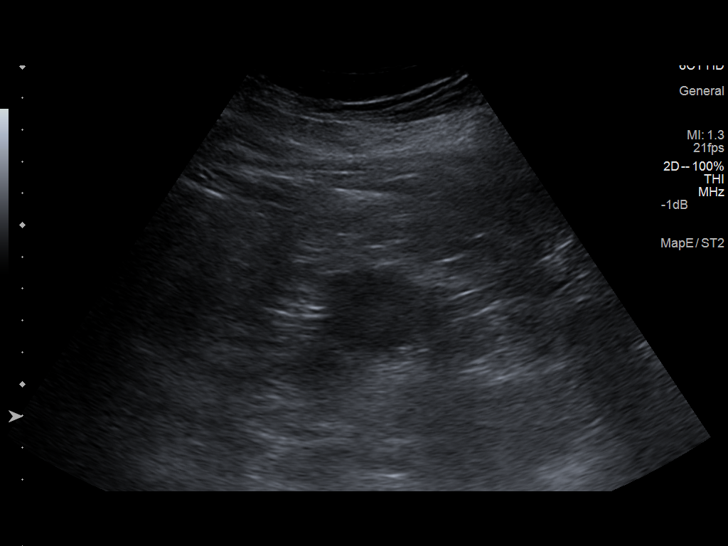

[13 of 25 positions shown; findings below may reference images not displayed]

FINDINGS: Right Kidney:

Length: 10.1 cm. There is slight diffuse cortical thinning of the
right kidney. Echogenicity of the cortex is slightly increased.
Approximately two tiny echogenic areas are seen within the right
renal collecting system, likely reflecting nonobstructing stones. No
mass or hydronephrosis visualized.

Left Kidney:

Length: Measures 9.7 cm in sagittal length.. There is slight
thinning of the renal cortex. The cortex appears within normal
limits for echogenicity. There is a stable upper pole central sinus
cyst. Negative for hydronephrosis. 6 x 1 mm echogenic area within
the midpole of left kidney may reflect a small stone fragment.

There is no evidence of ureteral dilatation.

Bladder:

The urinary bladder is moderately distended at the time imaging.
There is a moderate amount of echogenic debris layering dependently
within the bladder. Some of the echogenic areas are somewhat rounded
and could be stone fragments or blood clots.
IMPRESSION: 1. Negative for hydronephrosis.
2. Moderate amount of debris layering within the urinary bladder.
Some of the echogenic debris within the bladder somewhat rounded in
could reflect stone fragments given history of recent lithotripsy,
or blood clots.
3. Renal sinus cysts left kidney.
4. Suspect 1 or 2 small nonobstructing stones in both kidneys.

## 2014-10-13 IMAGING — CR DG ABDOMEN 1V
1 series · 1 of 1 positions shown · non-contrast
Comparison: Abdominal radiograph - 01/22/2013; 01/14/2013; CT
abdomen pelvis -01/03/2013

CLINICAL DATA: Post left-sided ureteral ESWL

EXAM:
ABDOMEN - 1 VIEW

[ap (kub)]
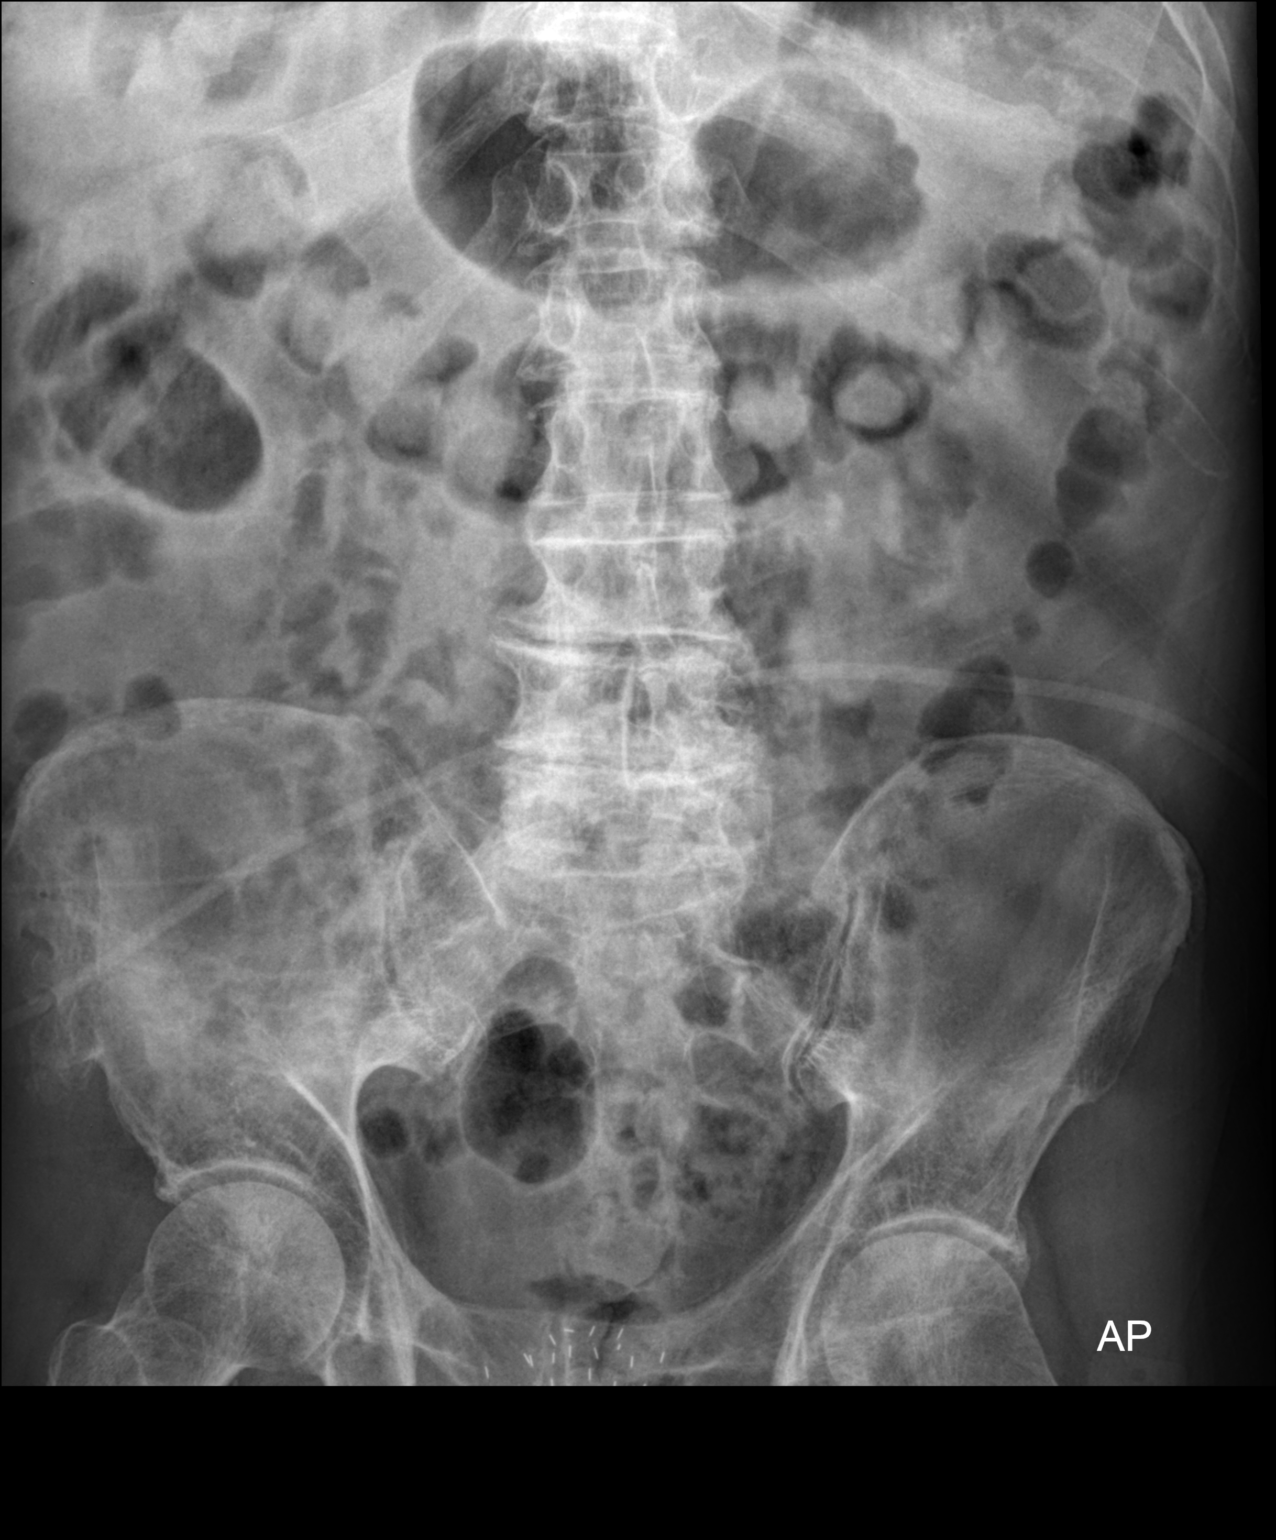

[1 of 1 positions shown; findings below may reference images not displayed]

FINDINGS: There are 2 discrete punctate opacities which overlie the expected
location of the superior aspect of the left ureter with dominant
opacity measuring approximately 0.6 x 0.4 cm an additional opacity
measuring approximately 0.4 x 0.4 cm.

No additional opacities overlie the expected location of either
renal fossa, the right ureter or the urinary bladder, though note,
evaluation somewhat degraded secondary to overlying bowel gas and
stool.

Multilevel lumbar spine DDD. Brachytherapy seeds overlie the lower
pelvis.
IMPRESSION: Grossly unchanged size and location of suspected adjacent renal
stones within the superior aspect of the left ureter. Further
evaluation could be performed with abdominal CT as clinically
indicated.
